# Patient Record
Sex: Female | Born: 1945
Health system: Southern US, Community
[De-identification: ages and names within clinical notes are randomized; demographics above are authoritative.]

## PROBLEM LIST (undated history)

## (undated) DIAGNOSIS — C801 Malignant (primary) neoplasm, unspecified: Secondary | ICD-10-CM

## (undated) DIAGNOSIS — I10 Essential (primary) hypertension: Secondary | ICD-10-CM

## (undated) DIAGNOSIS — K219 Gastro-esophageal reflux disease without esophagitis: Secondary | ICD-10-CM

## (undated) HISTORY — PX: BLADDER SURGERY: SHX569

---

## 2017-12-22 DIAGNOSIS — Z23 Encounter for immunization: Secondary | ICD-10-CM | POA: Diagnosis not present

## 2017-12-22 DIAGNOSIS — S61217A Laceration without foreign body of left little finger without damage to nail, initial encounter: Secondary | ICD-10-CM | POA: Diagnosis not present

## 2017-12-22 DIAGNOSIS — S61011A Laceration without foreign body of right thumb without damage to nail, initial encounter: Secondary | ICD-10-CM | POA: Diagnosis not present

## 2017-12-22 DIAGNOSIS — W268XXA Contact with other sharp object(s), not elsewhere classified, initial encounter: Secondary | ICD-10-CM | POA: Diagnosis not present

## 2017-12-22 DIAGNOSIS — S61112A Laceration without foreign body of left thumb with damage to nail, initial encounter: Secondary | ICD-10-CM | POA: Diagnosis not present

## 2017-12-30 DIAGNOSIS — Z4802 Encounter for removal of sutures: Secondary | ICD-10-CM | POA: Diagnosis not present

## 2018-04-12 DIAGNOSIS — Z23 Encounter for immunization: Secondary | ICD-10-CM | POA: Diagnosis not present

## 2018-04-12 DIAGNOSIS — Z7189 Other specified counseling: Secondary | ICD-10-CM | POA: Diagnosis not present

## 2018-04-12 DIAGNOSIS — Z1331 Encounter for screening for depression: Secondary | ICD-10-CM | POA: Diagnosis not present

## 2018-04-12 DIAGNOSIS — Z1211 Encounter for screening for malignant neoplasm of colon: Secondary | ICD-10-CM | POA: Diagnosis not present

## 2018-04-12 DIAGNOSIS — R5383 Other fatigue: Secondary | ICD-10-CM | POA: Diagnosis not present

## 2018-04-12 DIAGNOSIS — Z6828 Body mass index (BMI) 28.0-28.9, adult: Secondary | ICD-10-CM | POA: Diagnosis not present

## 2018-04-12 DIAGNOSIS — Z299 Encounter for prophylactic measures, unspecified: Secondary | ICD-10-CM | POA: Diagnosis not present

## 2018-04-12 DIAGNOSIS — I10 Essential (primary) hypertension: Secondary | ICD-10-CM | POA: Diagnosis not present

## 2018-04-12 DIAGNOSIS — Z1339 Encounter for screening examination for other mental health and behavioral disorders: Secondary | ICD-10-CM | POA: Diagnosis not present

## 2018-04-12 DIAGNOSIS — Z0000 Encounter for general adult medical examination without abnormal findings: Secondary | ICD-10-CM | POA: Diagnosis not present

## 2018-04-13 DIAGNOSIS — Z79899 Other long term (current) drug therapy: Secondary | ICD-10-CM | POA: Diagnosis not present

## 2018-04-13 DIAGNOSIS — R5383 Other fatigue: Secondary | ICD-10-CM | POA: Diagnosis not present

## 2018-05-09 DIAGNOSIS — Z86010 Personal history of colonic polyps: Secondary | ICD-10-CM | POA: Diagnosis not present

## 2018-05-19 DIAGNOSIS — Z1211 Encounter for screening for malignant neoplasm of colon: Secondary | ICD-10-CM | POA: Diagnosis not present

## 2018-05-19 DIAGNOSIS — K641 Second degree hemorrhoids: Secondary | ICD-10-CM | POA: Diagnosis not present

## 2018-05-19 DIAGNOSIS — D128 Benign neoplasm of rectum: Secondary | ICD-10-CM | POA: Diagnosis not present

## 2018-05-19 DIAGNOSIS — Z886 Allergy status to analgesic agent status: Secondary | ICD-10-CM | POA: Diagnosis not present

## 2018-05-19 DIAGNOSIS — K644 Residual hemorrhoidal skin tags: Secondary | ICD-10-CM | POA: Diagnosis not present

## 2018-05-19 DIAGNOSIS — Z86010 Personal history of colonic polyps: Secondary | ICD-10-CM | POA: Diagnosis not present

## 2018-05-19 DIAGNOSIS — K219 Gastro-esophageal reflux disease without esophagitis: Secondary | ICD-10-CM | POA: Diagnosis not present

## 2018-05-19 DIAGNOSIS — K621 Rectal polyp: Secondary | ICD-10-CM | POA: Diagnosis not present

## 2018-05-19 DIAGNOSIS — Z79899 Other long term (current) drug therapy: Secondary | ICD-10-CM | POA: Diagnosis not present

## 2018-05-19 DIAGNOSIS — Z90710 Acquired absence of both cervix and uterus: Secondary | ICD-10-CM | POA: Diagnosis not present

## 2018-05-19 DIAGNOSIS — I10 Essential (primary) hypertension: Secondary | ICD-10-CM | POA: Diagnosis not present

## 2018-06-06 DIAGNOSIS — Z86010 Personal history of colonic polyps: Secondary | ICD-10-CM | POA: Diagnosis not present

## 2018-07-27 ENCOUNTER — Other Ambulatory Visit: Payer: Self-pay | Admitting: Internal Medicine

## 2018-07-27 DIAGNOSIS — R921 Mammographic calcification found on diagnostic imaging of breast: Secondary | ICD-10-CM | POA: Diagnosis not present

## 2018-08-02 ENCOUNTER — Ambulatory Visit
Admission: RE | Admit: 2018-08-02 | Discharge: 2018-08-02 | Disposition: A | Discharge status: Home / Self Care | Payer: Medicare HMO | Source: Ambulatory Visit | Attending: Internal Medicine | Admitting: Internal Medicine

## 2018-08-02 DIAGNOSIS — R921 Mammographic calcification found on diagnostic imaging of breast: Secondary | ICD-10-CM

## 2018-08-02 DIAGNOSIS — D0512 Intraductal carcinoma in situ of left breast: Secondary | ICD-10-CM | POA: Diagnosis not present

## 2018-08-04 ENCOUNTER — Other Ambulatory Visit: Payer: Self-pay | Admitting: Internal Medicine

## 2018-08-04 DIAGNOSIS — R921 Mammographic calcification found on diagnostic imaging of breast: Secondary | ICD-10-CM

## 2018-08-06 DIAGNOSIS — C801 Malignant (primary) neoplasm, unspecified: Secondary | ICD-10-CM

## 2018-08-06 HISTORY — DX: Malignant (primary) neoplasm, unspecified: C80.1

## 2018-08-09 ENCOUNTER — Ambulatory Visit
Admission: RE | Admit: 2018-08-09 | Discharge: 2018-08-09 | Disposition: A | Discharge status: Home / Self Care | Payer: Medicare HMO | Source: Ambulatory Visit | Attending: Internal Medicine | Admitting: Internal Medicine

## 2018-08-09 DIAGNOSIS — R921 Mammographic calcification found on diagnostic imaging of breast: Secondary | ICD-10-CM

## 2018-08-09 DIAGNOSIS — N6092 Unspecified benign mammary dysplasia of left breast: Secondary | ICD-10-CM | POA: Diagnosis not present

## 2018-08-16 ENCOUNTER — Ambulatory Visit
Admission: RE | Admit: 2018-08-16 | Discharge: 2018-08-16 | Disposition: A | Discharge status: Home / Self Care | Payer: Medicare HMO | Source: Ambulatory Visit | Attending: Internal Medicine | Admitting: Internal Medicine

## 2018-08-16 DIAGNOSIS — R921 Mammographic calcification found on diagnostic imaging of breast: Secondary | ICD-10-CM

## 2018-08-16 DIAGNOSIS — D241 Benign neoplasm of right breast: Secondary | ICD-10-CM | POA: Diagnosis not present

## 2018-08-29 ENCOUNTER — Other Ambulatory Visit: Payer: Self-pay

## 2018-08-29 ENCOUNTER — Ambulatory Visit: Payer: Self-pay | Admitting: Surgery

## 2018-08-29 ENCOUNTER — Other Ambulatory Visit: Payer: Self-pay | Admitting: Surgery

## 2018-08-29 ENCOUNTER — Encounter (HOSPITAL_BASED_OUTPATIENT_CLINIC_OR_DEPARTMENT_OTHER): Payer: Self-pay | Admitting: *Deleted

## 2018-08-29 ENCOUNTER — Encounter (HOSPITAL_BASED_OUTPATIENT_CLINIC_OR_DEPARTMENT_OTHER)
Admission: RE | Admit: 2018-08-29 | Discharge: 2018-08-29 | Disposition: A | Discharge status: Home / Self Care | Payer: Medicare HMO | Source: Ambulatory Visit | Attending: Surgery | Admitting: Surgery

## 2018-08-29 DIAGNOSIS — D0512 Intraductal carcinoma in situ of left breast: Secondary | ICD-10-CM

## 2018-08-29 DIAGNOSIS — D249 Benign neoplasm of unspecified breast: Secondary | ICD-10-CM | POA: Diagnosis not present

## 2018-08-29 DIAGNOSIS — D241 Benign neoplasm of right breast: Secondary | ICD-10-CM | POA: Diagnosis not present

## 2018-08-29 DIAGNOSIS — Z01818 Encounter for other preprocedural examination: Secondary | ICD-10-CM | POA: Insufficient documentation

## 2018-08-29 LAB — BASIC METABOLIC PANEL
Anion gap: 6 (ref 5–15)
BUN: 12 mg/dL (ref 8–23)
CO2: 26 mmol/L (ref 22–32)
Calcium: 9.9 mg/dL (ref 8.9–10.3)
Chloride: 106 mmol/L (ref 98–111)
Creatinine, Ser: 0.67 mg/dL (ref 0.44–1.00)
GFR calc Af Amer: >60 mL/min (ref >60)
GFR calc non Af Amer: >60 mL/min (ref >60)
Glucose, Bld: 137 mg/dL — ABNORMAL HIGH (ref 70–99)
Potassium: 4.2 mmol/L (ref 3.5–5.1)
Sodium: 138 mmol/L (ref 135–145)

## 2018-08-29 NOTE — H&P (View-Only) (Signed)
Erika Gould Documented: 08/29/2018 10:00 AM Location: [facility] Patient #: [id number] DOB: [birth year] Widowed / Language: Erika Gould / Race: Black or African American Female  History of Present Illness Erika Gould A. Romaldo Saville MD; 08/29/2018 11:13 AM) Patient words: Patient sent at the request of Dr.Vyas for abnormal screening mammogram. The patient went recent bilateral screening mammography with subsequent diagnostic mammography and was found to have multiple areas of microcalcification. She underwent 2 biopsies on the left which showed high-grade DCIS with comedonecrosis and one area on the right which showed papillomatosis. She has no family history of breast cancer. This is the first biopsy she's had before. She is currently so warfarin both biopsies but is not having any significant pain, redness or drainage. Review of mammography reveals his areas to be quite faint. Size is difficult to discern looks like 7 - 8 mm in each biopsy site.                  ADDITIONAL INFORMATION: 1. PROGNOSTIC INDICATORS Results: IMMUNOHISTOCHEMICAL AND MORPHOMETRIC ANALYSIS PERFORMED MANUALLY Estrogen Receptor: 90%, POSITIVE, STRONG STAINING INTENSITY Progesterone Receptor: 50%, POSITIVE, STRONG STAINING INTENSITY REFERENCE RANGE ESTROGEN RECEPTOR NEGATIVE 0% POSITIVE =>1% REFERENCE RANGE PROGESTERONE RECEPTOR NEGATIVE 0% POSITIVE =>1% All controls stained appropriately Erika Sheller MD Pathologist, Electronic Signature ( Signed 02/02/20cornett1 FINAL DIAGNOSIS Diagnosis 1. Breast, left, needle core biopsy, upper outer - DUCTAL CARCINOMA IN SITU - SEE COMMENT 2. Breast, left, needle core biopsy, upper inner - DUCTAL CARCINOMA IN SITU - SEE COMMENT 1 of 3 FINAL for Erika Gould ([id number]) Microscopic Comment 1. Immunohistochemistry was performed on the biopsies to assess for an invasive component (SMM, calponin, and p63). Myoepithelial cells are present  supporting the diagnosis of ductal carcinoma in situ. E-cadherin is positive. Based on the biopsy, the ductal carcinoma in situ has a comedo pattern, high nuclear grade and measures 0.7 cm in greatest linear extent. Prognostic markers (ER/PR) are pending and will be reported in an addendum. Dr. Jeannie Gould has reviewed the case and agrees with the diagnosis. These results were called to [facility] on August 04, 2018. Erika Sheller MD Pathologist, Electronic Signature (Case signed 08/04/2018) Specimen Gross and Clinical Information Specimen Comment 1. TIF: 10 AM, extracted < 5 min; calcs 2. TIF: 1015 AM, extracted < 5 min Specimen(s) Obtained: 1. Breast, left, needle core biopsy, upper outer 2. Breast, left, needle core biopsy, upper inner Specimen Clinical       Diagnosis Breast, left, needle core biopsy, upper inner - ATYPICAL DUCTAL HYPERPLASIA WITH CALCIFICATIONS. SEE NOTE. Diagnosis Note Immunostain for CK5/6 is negative, consistent with the above diagnosis. Dr. Lyndon Gould has reviewed this case and concurs with the interpretation. [facility] was notified on 08/11/2018. (NK:ah 08/11/18) Erika Folds MD Pathologist, Electronic Signature (Case signed 08/11/2018) Specimen Gross and Clinical Information Specimen Comment TIF: 10 AM, extracted < 5 min; recent diagnosis DCIS left breast; this is an additional group of calcs Specimen(s) Obtained: Breast, left, needle core biopsy, upper inner Specimen Clinical          Diagnosis Breast, right, needle core biopsy, lower outer quadrant - DUCTAL PAPILLOMA(S). FLORID DUCT EPITHELIAL HYPERPLASIA. FIBROCYSTIC CHANGES. CALCIFICATIONS. Microscopic Comment Immunohistochemistry for p63, Calponin and SMM-1 demonstrates the presence of myoepithelium in the select focus. CK5/6 is positive. The results were reported to [facility] on 08/17/2018. [name] Pathologist, Electronic Signature (Case signed 08/18/2018) Specimen Gross and Clinical Information Specimen Comment In formalin 10:05;  extracted less than 5 minutes; calcifications; previous biopsy on left, not on right Specimen(s).  The patient is a 73 year old female.   Past Surgical History Erika Gould, [name]; 08/29/2018 10:00 AM) Breast Biopsy Bilateral. Colon Polyp Removal - Colonoscopy  Diagnostic Studies History Erika Gould, [name]; 08/29/2018 10:00 AM) Colonoscopy within last year Mammogram within last year Pap Smear >5 years ago  Allergies Erika Gould, [name]; 08/29/2018 10:01 AM) No Known Drug Allergies [08/29/2018]: Allergies Reconciled  Medication History Erika Gould, [name]; 08/29/2018 10:02 AM) Atenolol (50MG  Tablet, Oral) Active. Lisinopril-hydroCHLOROthiazide (20-12.5MG  Tablet, Oral) Active. Medications Reconciled  Social History Erika Gould, [name]; 08/29/2018 10:00 AM) Caffeine use Coffee. No alcohol use No drug use Tobacco use Never smoker.  Family History Erika Gould, [name]; 08/29/2018 10:00 AM) Breast Cancer Mother, Sister. Colon Polyps Brother, Sister. Diabetes Mellitus Mother. Respiratory Condition Father.  Pregnancy / Birth History Erika Gould, [name]; 08/29/2018 10:00 AM) Age of menopause 91-55 Gravida 2 Maternal age 47-25 Para 2  Other Problems Erika Gould, [place]; 08/29/2018 10:00 AM) Gastroesophageal Reflux Disease High blood pressure     Review of Systems (Erika Borromeo A. Laree Garron MD; 08/29/2018 11:13 AM) General Not Present- Appetite Loss, Chills, Fatigue, Fever, Night Sweats, Weight Gain and Weight Loss. Skin Not Present- Change in Wart/Mole, Dryness, Hives, Jaundice, New Lesions, Non-Healing Wounds, Rash and Ulcer. HEENT Not Present- Earache, Hearing Loss, Hoarseness, Nose Bleed, Oral Ulcers, Ringing in the Ears, Seasonal Allergies, Sinus Pain, Sore Throat, Visual Disturbances, Wears glasses/contact lenses and  Yellow Eyes. Respiratory Not Present- Bloody sputum, Chronic Cough, Difficulty Breathing, Snoring and Wheezing. Breast Not Present- Breast Mass, Breast Pain, Nipple Discharge and Skin Changes. Cardiovascular Not Present- Chest Pain, Difficulty Breathing Lying Down, Leg Cramps, Palpitations, Rapid Heart Rate, Shortness of Breath and Swelling of Extremities. Gastrointestinal Not Present- Abdominal Pain, Bloating, Bloody Stool, Change in Bowel Habits, Chronic diarrhea, Constipation, Difficulty Swallowing, Excessive gas, Gets full quickly at meals, Hemorrhoids, Indigestion, Nausea, Rectal Pain and Vomiting. Female Genitourinary Not Present- Frequency, Nocturia, Painful Urination, Pelvic Pain and Urgency. Musculoskeletal Present- Joint Pain. Not Present- Back Pain, Joint Stiffness, Muscle Pain, Muscle Weakness and Swelling of Extremities. Neurological Not Present- Decreased Memory, Fainting, Headaches, Numbness, Seizures, Tingling, Tremor, Trouble walking and Weakness. Psychiatric Not Present- Anxiety, Bipolar, Change in Sleep Pattern, Depression, Fearful and Frequent crying. Endocrine Not Present- Cold Intolerance, Excessive Hunger, Hair Changes, Heat Intolerance, Hot flashes and New Diabetes. Hematology Not Present- Blood Thinners, Easy Bruising, Excessive bleeding, Gland problems, HIV and Persistent Infections. All other systems negative  Vitals Erika Gould [name]; 08/29/2018 10:02 AM) 08/29/2018 10:02 AM Weight: 183.5 lb Height: 66in Body Surface Area: 1.93 m Body Mass Index: 29.62 kg/m  Temp.: 97.83F(Oral)  Pulse: 60 (Regular)  BP: 128/72 (Sitting, Left Arm, Standard)      Physical Exam (Tatijana Bierly A. Cisco Kindt MD; 08/29/2018 11:14 AM)  General Mental Status-Alert. General Appearance-Consistent with stated age. Hydration-Well hydrated. Voice-Normal.  Head and Neck Head-normocephalic, atraumatic with no lesions or palpable masses. Trachea-midline. Thyroid Gland  Characteristics - normal size and consistency.  Eye Eyeball - Bilateral-Extraocular movements intact. Sclera/Conjunctiva - Bilateral-No scleral icterus.  Chest and Lung Exam Chest and lung exam reveals -quiet, even and easy respiratory effort with no use of accessory muscles and on auscultation, normal breath sounds, no adventitious sounds and normal vocal resonance. Inspection Chest Wall - Normal. Back - normal.  Breast Breast - Left-Symmetric, Non Tender, No Biopsy scars, no Dimpling, No Inflammation, No Lumpectomy scars, No Mastectomy scars, No Peau d' Orange. Breast - Right-Symmetric, Non  Tender, No Biopsy scars, no Dimpling, No Inflammation, No Lumpectomy scars, No Mastectomy scars, No Peau d' Orange. Breast Lump-No Palpable Breast Mass.  Cardiovascular Cardiovascular examination reveals -normal heart sounds, regular rate and rhythm with no murmurs and normal pedal pulses bilaterally.  Abdomen Inspection Inspection of the abdomen reveals - No Hernias. Skin - Scar - no surgical scars. Palpation/Percussion Palpation and Percussion of the abdomen reveal - Soft, Non Tender, No Rebound tenderness, No Rigidity (guarding) and No hepatosplenomegaly. Auscultation Auscultation of the abdomen reveals - Bowel sounds normal.  Neurologic Neurologic evaluation reveals -alert and oriented x 3 with no impairment of recent or remote memory. Mental Status-Normal.  Musculoskeletal Normal Exam - Left-Upper Extremity Strength Normal and Lower Extremity Strength Normal. Normal Exam - Right-Upper Extremity Strength Normal and Lower Extremity Strength Normal.  Lymphatic Head & Neck  General Head & Neck Lymphatics: Bilateral - Description - Normal. Axillary  General Axillary Region: Bilateral - Description - Normal. Tenderness - Non Tender.    Assessment & Plan (Kelley Polinsky A. Nyquan Selbe MD; 08/29/2018 11:15 AM)  BREAST NEOPLASM, TIS (DCIS), LEFT (D05.12) Impression: Reviewed  options of treatment which include breast conserving surgery, mastectomy and reconstruction, and clinical trials. Given her high grade nature she would not be a good clinical trial candidate Risk of lumpectomy include bleeding, infection, seroma, more surgery, use of seed/wire, wound care, cosmetic deformity and the need for other treatments, death , blood clots, death. Pt agrees to proceed. Marland Kitchen She would like to proceed with lumpectomy and try to conserve her breast. She will need 2 lumpectomies on the left and recommend warm the right for the papillomatosis. She understands she may need further surgery depending on margin status and if there is more extensive disease. I doubt MRI would add much in her circumstance. Refer to medical and radiation oncology.   PAPILLOMA OF BREAST (D24.9)   PAPILLOMA OF RIGHT BREAST (D24.1)  Current Plans You are being scheduled for surgery- Our schedulers will call you.  You should hear from our office's scheduling department within 5 working days about the location, date, and time of surgery. We try to make accommodations for patient's preferences in scheduling surgery, but sometimes the OR schedule or the surgeon's schedule prevents [place] from making those accommodations.  If you have not heard from our office [phone]) in 5 working days, call the office and ask for your surgeon's nurse.  If you have other questions about your diagnosis, plan, or surgery, call the office and ask for your surgeon's nurse.  Pt Education - [facility] Breast Biopsy HCI: discussed with patient and provided information. Pt Education - flb breast cancer surgery: discussed with patient and provided information.

## 2018-08-29 NOTE — H&P (Signed)
Erika Gould Documented: 08/29/2018 10:00 AM Location: [facility] Patient #: [id number] DOB: 1946-05-13 Widowed / Language: Erika Gould / Race: Black or African American Female  History of Present Illness Erika Moores A. Racine Erby MD; 08/29/2018 11:13 AM) Patient words: Patient sent at Erika request of Dr.Vyas for abnormal screening mammogram. Erika patient went recent bilateral screening mammography with subsequent diagnostic mammography and was found to have multiple areas of microcalcification. She underwent 2 biopsies on Erika left which showed high-grade DCIS with comedonecrosis and one area on Erika right which showed papillomatosis. She has no family history of breast cancer. This is Erika first biopsy she's had before. She is currently so warfarin both biopsies but is not having any significant pain, redness or drainage. Review of mammography reveals his areas to be quite faint. Size is difficult to discern looks like 7 - 8 mm in each biopsy site.                  ADDITIONAL INFORMATION: 1. PROGNOSTIC INDICATORS Results: IMMUNOHISTOCHEMICAL AND MORPHOMETRIC ANALYSIS PERFORMED MANUALLY Estrogen Receptor: 90%, POSITIVE, STRONG STAINING INTENSITY Progesterone Receptor: 50%, POSITIVE, STRONG STAINING INTENSITY REFERENCE RANGE ESTROGEN RECEPTOR NEGATIVE 0% POSITIVE =>1% REFERENCE RANGE PROGESTERONE RECEPTOR NEGATIVE 0% POSITIVE =>1% All controls stained appropriately Erika Gould, Electronic Signature ( Signed 02/02/20cornett1 FINAL DIAGNOSIS Diagnosis 1. Breast, left, needle core biopsy, upper outer - DUCTAL CARCINOMA IN SITU - SEE COMMENT 2. Breast, left, needle core biopsy, upper inner - DUCTAL CARCINOMA IN SITU - SEE COMMENT 1 of 3 FINAL for Erika Gould ([id number]) Microscopic Comment 1. Immunohistochemistry was performed on Erika biopsies to assess for an invasive component (SMM, calponin, and p63). Myoepithelial cells are present  supporting Erika diagnosis of ductal carcinoma in situ. E-cadherin is positive. Based on Erika biopsy, Erika ductal carcinoma in situ has a comedo pattern, high nuclear grade and measures 0.7 cm in greatest linear extent. Prognostic markers (ER/PR) are pending and will be reported in an addendum. Dr. Jeannie Gould has reviewed Erika case and agrees with Erika diagnosis. These results were called to Erika Gould on August 04, 2018. Erika Gould, Electronic Signature (Case signed 08/04/2018) Specimen Gross and Clinical Information Specimen Comment 1. TIF: 10 AM, extracted < 5 min; calcs 2. TIF: 1015 AM, extracted < 5 min Specimen(s) Obtained: 1. Breast, left, needle core biopsy, upper outer 2. Breast, left, needle core biopsy, upper inner Specimen Clinical       Diagnosis Breast, left, needle core biopsy, upper inner - ATYPICAL DUCTAL HYPERPLASIA WITH CALCIFICATIONS. SEE NOTE. Diagnosis Note Immunostain for CK5/6 is negative, consistent with Erika above diagnosis. Dr. Lyndon Gould has reviewed this case and concurs with Erika interpretation. Erika Gould was notified on 08/11/2018. (Erika Gould:ah 08/11/18) Erika Folds MD Gould, Electronic Signature (Case signed 08/11/2018) Specimen Gross and Clinical Information Specimen Comment TIF: 10 AM, extracted < 5 min; recent diagnosis DCIS left breast; this is an additional group of calcs Specimen(s) Obtained: Breast, left, needle core biopsy, upper inner Specimen Clinical          Diagnosis Breast, right, needle core biopsy, lower outer quadrant - DUCTAL PAPILLOMA(S). FLORID DUCT EPITHELIAL HYPERPLASIA. FIBROCYSTIC CHANGES. CALCIFICATIONS. Microscopic Comment Immunohistochemistry for p63, Calponin and SMM-1 demonstrates Erika presence of myoepithelium in Erika select focus. CK5/6 is positive. Erika results were reported to Erika [place] on 08/17/2018. [name] Gould, Electronic Signature (Case signed 08/18/2018) Specimen Gross and Clinical Information Specimen Comment In formalin 10:05;  extracted less than 5 minutes; calcifications; previous biopsy on left, not on right Specimen(s).  Erika patient is a 73 year old female.   Past Surgical History Erika Gould, Erika Gould; 08/29/2018 10:00 AM) Breast Biopsy Bilateral. Colon Polyp Removal - Colonoscopy  Diagnostic Studies History Erika Gould, [name]; 08/29/2018 10:00 AM) Colonoscopy within last year Mammogram within last year Pap Smear >5 years ago  Allergies Erika Gould, [place]; 08/29/2018 10:01 AM) No Known Drug Allergies [08/29/2018]: Allergies Reconciled  Medication History Erika Gould, [name]; 08/29/2018 10:02 AM) Atenolol (50MG  Tablet, Oral) Active. Lisinopril-hydroCHLOROthiazide (20-12.5MG  Tablet, Oral) Active. Medications Reconciled  Social History Erika Gould, [name]; 08/29/2018 10:00 AM) Caffeine use Coffee. No alcohol use No drug use Tobacco use Never smoker.  Family History Erika Gould, [place]; 08/29/2018 10:00 AM) Breast Cancer Mother, Sister. Colon Polyps Brother, Sister. Diabetes Mellitus Mother. Respiratory Condition Father.  Pregnancy / Birth History Erika Gould, [place]; 08/29/2018 10:00 AM) Age of menopause 50-55 Gravida 2 Maternal age 28-25 Para 2  Other Problems Erika Gould, [place]; 08/29/2018 10:00 AM) Gastroesophageal Reflux Disease High blood pressure     Review of Systems (Calel Pisarski A. Onnika Siebel MD; 08/29/2018 11:13 AM) General Not Present- Appetite Loss, Chills, Fatigue, Fever, Night Sweats, Weight Gain and Weight Loss. Skin Not Present- Change in Wart/Mole, Dryness, Hives, Jaundice, New Lesions, Non-Healing Wounds, Rash and Ulcer. HEENT Not Present- Earache, Hearing Loss, Hoarseness, Nose Bleed, Oral Ulcers, Ringing in Erika Ears, Seasonal Allergies, Sinus Pain, Sore Throat, Visual Disturbances, Wears glasses/contact lenses and  Yellow Eyes. Respiratory Not Present- Bloody sputum, Chronic Cough, Difficulty Breathing, Snoring and Wheezing. Breast Not Present- Breast Mass, Breast Pain, Nipple Discharge and Skin Changes. Cardiovascular Not Present- Chest Pain, Difficulty Breathing Lying Down, Leg Cramps, Palpitations, Rapid Heart Rate, Shortness of Breath and Swelling of Extremities. Gastrointestinal Not Present- Abdominal Pain, Bloating, Bloody Stool, Change in Bowel Habits, Chronic diarrhea, Constipation, Difficulty Swallowing, Excessive gas, Gets full quickly at meals, Hemorrhoids, Indigestion, Nausea, Rectal Pain and Vomiting. Female Genitourinary Not Present- Frequency, Nocturia, Painful Urination, Pelvic Pain and Urgency. Musculoskeletal Present- Joint Pain. Not Present- Back Pain, Joint Stiffness, Muscle Pain, Muscle Weakness and Swelling of Extremities. Neurological Not Present- Decreased Memory, Fainting, Headaches, Numbness, Seizures, Tingling, Tremor, Trouble walking and Weakness. Psychiatric Not Present- Anxiety, Bipolar, Change in Sleep Pattern, Depression, Fearful and Frequent crying. Endocrine Not Present- Cold Intolerance, Excessive Hunger, Hair Changes, Heat Intolerance, Hot flashes and New Diabetes. Hematology Not Present- Blood Thinners, Easy Bruising, Excessive bleeding, Gland problems, HIV and Persistent Infections. All other systems negative  Vitals Erika Gould [name]; 08/29/2018 10:02 AM) 08/29/2018 10:02 AM Weight: 183.5 lb Height: 66in Body Surface Area: 1.93 m Body Mass Index: 29.62 kg/m  Temp.: 97.18F(Oral)  Pulse: 60 (Regular)  BP: 128/72 (Sitting, Left Arm, Standard)      Physical Exam (Imran Nuon A. Murl Zogg MD; 08/29/2018 11:14 AM)  General Mental Status-Alert. General Appearance-Consistent with stated age. Hydration-Well hydrated. Voice-Normal.  Head and Neck Head-normocephalic, atraumatic with no lesions or palpable masses. Trachea-midline. Thyroid Gland  Characteristics - normal size and consistency.  Eye Eyeball - Bilateral-Extraocular movements intact. Sclera/Conjunctiva - Bilateral-No scleral icterus.  Chest and Lung Exam Chest and lung exam reveals -quiet, even and easy respiratory effort with no use of accessory muscles and on auscultation, normal breath sounds, no adventitious sounds and normal vocal resonance. Inspection Chest Wall - Normal. Back - normal.  Breast Breast - Left-Symmetric, Non Tender, No Biopsy scars, no Dimpling, No Inflammation, No Lumpectomy scars, No Mastectomy scars, No Peau d' Orange. Breast - Right-Symmetric, Non  Tender, No Biopsy scars, no Dimpling, No Inflammation, No Lumpectomy scars, No Mastectomy scars, No Peau d' Orange. Breast Lump-No Palpable Breast Mass.  Cardiovascular Cardiovascular examination reveals -normal heart sounds, regular rate and rhythm with no murmurs and normal pedal pulses bilaterally.  Abdomen Inspection Inspection of Erika abdomen reveals - No Hernias. Skin - Scar - no surgical scars. Palpation/Percussion Palpation and Percussion of Erika abdomen reveal - Soft, Non Tender, No Rebound tenderness, No Rigidity (guarding) and No hepatosplenomegaly. Auscultation Auscultation of Erika abdomen reveals - Bowel sounds normal.  Neurologic Neurologic evaluation reveals -alert and oriented x 3 with no impairment of recent or remote memory. Mental Status-Normal.  Musculoskeletal Normal Exam - Left-Upper Extremity Strength Normal and Lower Extremity Strength Normal. Normal Exam - Right-Upper Extremity Strength Normal and Lower Extremity Strength Normal.  Lymphatic Head & Neck  General Head & Neck Lymphatics: Bilateral - Description - Normal. Axillary  General Axillary Region: Bilateral - Description - Normal. Tenderness - Non Tender.    Assessment & Plan (Gergory Biello A. Kabrina Christiano MD; 08/29/2018 11:15 AM)  BREAST NEOPLASM, TIS (DCIS), LEFT (D05.12) Impression: Reviewed  options of treatment which include breast conserving surgery, mastectomy and reconstruction, and clinical trials. Given her high grade nature she would not be a good clinical trial candidate Risk of lumpectomy include bleeding, infection, seroma, more surgery, use of seed/wire, wound care, cosmetic deformity and Erika need for other treatments, death , blood clots, death. Pt agrees to proceed. Marland Kitchen She would like to proceed with lumpectomy and try to conserve her breast. She will need 2 lumpectomies on Erika left and recommend warm Erika right for Erika papillomatosis. She understands she may need further surgery depending on margin status and if there is more extensive disease. I doubt MRI would add much in her circumstance. Refer to medical and radiation oncology.   PAPILLOMA OF BREAST (D24.9)   PAPILLOMA OF RIGHT BREAST (D24.1)  Current Plans You are being scheduled for surgery- Our schedulers will call you.  You should hear from our office's scheduling department within 5 working days about Erika location, date, and time of surgery. We try to make accommodations for patient's preferences in scheduling surgery, but sometimes Erika OR schedule or Erika surgeon's schedule prevents [place] from making those accommodations.  If you have not heard from our office [phone]) in 5 working days, call Erika office and ask for your surgeon's nurse.  If you have other questions about your diagnosis, plan, or surgery, call Erika office and ask for your surgeon's nurse.  Pt Education - [facility] Breast Biopsy HCI: discussed with patient and provided information. Pt Education - flb breast cancer surgery: discussed with patient and provided information.

## 2018-08-29 NOTE — Progress Notes (Signed)
Patient had EKG and BMET in PAT. Patient given surgical soap and ERASE ensure drink, instructions given and patient verbalized understanding.

## 2018-08-30 NOTE — Progress Notes (Signed)
EKG reviewed by Dr. Turk, will proceed with surgery as scheduled. 

## 2018-09-05 ENCOUNTER — Encounter: Payer: Self-pay | Admitting: Hematology and Oncology

## 2018-09-05 ENCOUNTER — Telehealth: Payer: Self-pay | Admitting: Hematology and Oncology

## 2018-09-05 NOTE — Telephone Encounter (Signed)
A new patient breast appt has been scheduled for the pt to see Dr. Lindi Adie on 4/1 at 3pm. Letter mailed to the pt.

## 2018-09-19 ENCOUNTER — Ambulatory Visit
Admission: RE | Admit: 2018-09-19 | Discharge: 2018-09-19 | Disposition: A | Discharge status: Home / Self Care | Payer: Medicare HMO | Source: Ambulatory Visit | Attending: Surgery | Admitting: Surgery

## 2018-09-19 ENCOUNTER — Other Ambulatory Visit: Payer: Self-pay

## 2018-09-19 ENCOUNTER — Other Ambulatory Visit: Payer: Self-pay | Admitting: Surgery

## 2018-09-19 DIAGNOSIS — D0512 Intraductal carcinoma in situ of left breast: Secondary | ICD-10-CM

## 2018-09-19 DIAGNOSIS — N6082 Other benign mammary dysplasias of left breast: Secondary | ICD-10-CM | POA: Diagnosis not present

## 2018-09-19 DIAGNOSIS — D241 Benign neoplasm of right breast: Secondary | ICD-10-CM | POA: Diagnosis not present

## 2018-09-19 NOTE — Anesthesia Preprocedure Evaluation (Addendum)
Anesthesia Evaluation  Patient identified by MRN, date of birth, ID band Patient awake    Reviewed: Allergy & Precautions, H&P , NPO status , Patient's Chart, lab work & pertinent test results  Airway Mallampati: I  TM Distance: >3 FB Neck ROM: Full    Dental no notable dental hx. (+) Teeth Intact,    Pulmonary neg pulmonary ROS,    Pulmonary exam normal breath sounds clear to auscultation       Cardiovascular Exercise Tolerance: Good hypertension, Pt. on medications and Pt. on home beta blockers negative cardio ROS Normal cardiovascular exam Rhythm:Regular Rate:Normal  EKG 2/20 Sinus bradycardia with Premature atrial complexes Minimal voltage criteria for LVH, may be normal variant   Neuro/Psych negative neurological ROS  negative psych ROS   GI/Hepatic negative GI ROS, Neg liver ROS, GERD  Medicated,  Endo/Other  negative endocrine ROS  Renal/GU negative Renal ROS  negative genitourinary   Musculoskeletal negative musculoskeletal ROS (+)   Abdominal   Peds negative pediatric ROS (+)  Hematology negative hematology ROS (+)   Anesthesia Other Findings   Reproductive/Obstetrics negative OB ROS                            Anesthesia Physical Anesthesia Plan  ASA: II  Anesthesia Plan: General   Post-op Pain Management:    Induction: Intravenous  PONV Risk Score and Plan: 3 and Ondansetron, Treatment may vary due to age or medical condition, Dexamethasone and Midazolam  Airway Management Planned: Oral ETT  Additional Equipment:   Intra-op Plan:   Post-operative Plan: Extubation in OR  Informed Consent: I have reviewed the patients History and Physical, chart, labs and discussed the procedure including the risks, benefits and alternatives for the proposed anesthesia with the patient or authorized representative who has indicated his/her understanding and acceptance.        Plan Discussed with: CRNA, Anesthesiologist and Surgeon  Anesthesia Plan Comments: (  )        Anesthesia Quick Evaluation

## 2018-09-20 ENCOUNTER — Ambulatory Visit
Admission: RE | Admit: 2018-09-20 | Discharge: 2018-09-20 | Disposition: A | Discharge status: Home / Self Care | Payer: Medicare HMO | Source: Ambulatory Visit | Attending: Surgery | Admitting: Surgery

## 2018-09-20 ENCOUNTER — Encounter (HOSPITAL_BASED_OUTPATIENT_CLINIC_OR_DEPARTMENT_OTHER)
Admission: RE | Disposition: A | Discharge status: Home / Self Care | Payer: Self-pay | Source: Home / Self Care | Attending: Surgery

## 2018-09-20 ENCOUNTER — Ambulatory Visit (HOSPITAL_BASED_OUTPATIENT_CLINIC_OR_DEPARTMENT_OTHER): Payer: Medicare HMO | Admitting: Anesthesiology

## 2018-09-20 ENCOUNTER — Encounter (HOSPITAL_BASED_OUTPATIENT_CLINIC_OR_DEPARTMENT_OTHER): Payer: Self-pay | Admitting: *Deleted

## 2018-09-20 ENCOUNTER — Ambulatory Visit (HOSPITAL_BASED_OUTPATIENT_CLINIC_OR_DEPARTMENT_OTHER)
Admission: RE | Admit: 2018-09-20 | Discharge: 2018-09-20 | Disposition: A | Discharge status: Home / Self Care | Payer: Medicare HMO | Attending: Surgery | Admitting: Surgery

## 2018-09-20 ENCOUNTER — Other Ambulatory Visit: Payer: Self-pay

## 2018-09-20 DIAGNOSIS — Z170 Estrogen receptor positive status [ER+]: Secondary | ICD-10-CM | POA: Insufficient documentation

## 2018-09-20 DIAGNOSIS — D242 Benign neoplasm of left breast: Secondary | ICD-10-CM | POA: Insufficient documentation

## 2018-09-20 DIAGNOSIS — I10 Essential (primary) hypertension: Secondary | ICD-10-CM | POA: Insufficient documentation

## 2018-09-20 DIAGNOSIS — D0512 Intraductal carcinoma in situ of left breast: Secondary | ICD-10-CM

## 2018-09-20 DIAGNOSIS — Z79899 Other long term (current) drug therapy: Secondary | ICD-10-CM | POA: Diagnosis not present

## 2018-09-20 DIAGNOSIS — N6011 Diffuse cystic mastopathy of right breast: Secondary | ICD-10-CM | POA: Insufficient documentation

## 2018-09-20 DIAGNOSIS — D241 Benign neoplasm of right breast: Secondary | ICD-10-CM | POA: Insufficient documentation

## 2018-09-20 DIAGNOSIS — N6092 Unspecified benign mammary dysplasia of left breast: Secondary | ICD-10-CM | POA: Diagnosis not present

## 2018-09-20 DIAGNOSIS — N6082 Other benign mammary dysplasias of left breast: Secondary | ICD-10-CM | POA: Diagnosis not present

## 2018-09-20 DIAGNOSIS — N6012 Diffuse cystic mastopathy of left breast: Secondary | ICD-10-CM | POA: Diagnosis not present

## 2018-09-20 HISTORY — DX: Essential (primary) hypertension: I10

## 2018-09-20 HISTORY — DX: Gastro-esophageal reflux disease without esophagitis: K21.9

## 2018-09-20 HISTORY — DX: Malignant (primary) neoplasm, unspecified: C80.1

## 2018-09-20 HISTORY — PX: BREAST LUMPECTOMY WITH RADIOACTIVE SEED LOCALIZATION: SHX6424

## 2018-09-20 SURGERY — BREAST LUMPECTOMY WITH RADIOACTIVE SEED LOCALIZATION
Anesthesia: General | Site: Breast | Laterality: Bilateral

## 2018-09-20 MED ORDER — IBUPROFEN 800 MG PO TABS: 800.0000 mg | ORAL_TABLET | Freq: Three times a day (TID) | ORAL | 0 refills | Status: DC | PRN

## 2018-09-20 MED ORDER — PROPOFOL 10 MG/ML IV BOLUS
INTRAVENOUS | Status: AC
  Filled 2018-09-20: qty 20

## 2018-09-20 MED ORDER — ONDANSETRON 4 MG PO TBDP
4.0000 mg | ORAL_TABLET | Freq: Once | ORAL | Status: AC
  Administered 2018-09-20: 4 mg via ORAL

## 2018-09-20 MED ORDER — ACETAMINOPHEN 325 MG PO TABS: 325.0000 mg | ORAL_TABLET | ORAL | Status: DC | PRN

## 2018-09-20 MED ORDER — CEFAZOLIN SODIUM-DEXTROSE 2-4 GM/100ML-% IV SOLN
INTRAVENOUS | Status: AC
  Filled 2018-09-20: qty 100

## 2018-09-20 MED ORDER — MIDAZOLAM HCL 2 MG/2ML IJ SOLN: 1.0000 mg | INTRAMUSCULAR | Status: DC | PRN

## 2018-09-20 MED ORDER — ONDANSETRON HCL 4 MG/2ML IJ SOLN: 4.0000 mg | Freq: Once | INTRAMUSCULAR | Status: DC | PRN

## 2018-09-20 MED ORDER — GABAPENTIN 300 MG PO CAPS
300.0000 mg | ORAL_CAPSULE | ORAL | Status: AC
  Administered 2018-09-20: 300 mg via ORAL

## 2018-09-20 MED ORDER — ACETAMINOPHEN 500 MG PO TABS
ORAL_TABLET | ORAL | Status: AC
  Filled 2018-09-20: qty 2

## 2018-09-20 MED ORDER — CEFAZOLIN SODIUM-DEXTROSE 2-4 GM/100ML-% IV SOLN
2.0000 g | INTRAVENOUS | Status: AC
  Administered 2018-09-20: 2 g via INTRAVENOUS

## 2018-09-20 MED ORDER — FENTANYL CITRATE (PF) 100 MCG/2ML IJ SOLN
25.0000 ug | INTRAMUSCULAR | Status: DC | PRN
  Administered 2018-09-20: 50 ug via INTRAVENOUS

## 2018-09-20 MED ORDER — EPHEDRINE SULFATE 50 MG/ML IJ SOLN
INTRAMUSCULAR | Status: DC | PRN
  Administered 2018-09-20 (×3): 10 mg via INTRAVENOUS

## 2018-09-20 MED ORDER — ONDANSETRON HCL 4 MG/2ML IJ SOLN
INTRAMUSCULAR | Status: DC | PRN
  Administered 2018-09-20: 4 mg via INTRAVENOUS

## 2018-09-20 MED ORDER — GABAPENTIN 300 MG PO CAPS
ORAL_CAPSULE | ORAL | Status: AC
  Filled 2018-09-20: qty 1

## 2018-09-20 MED ORDER — DEXAMETHASONE SODIUM PHOSPHATE 10 MG/ML IJ SOLN
INTRAMUSCULAR | Status: AC
  Filled 2018-09-20: qty 1

## 2018-09-20 MED ORDER — SODIUM CHLORIDE (PF) 0.9 % IJ SOLN
INTRAMUSCULAR | Status: AC
  Filled 2018-09-20: qty 10

## 2018-09-20 MED ORDER — FENTANYL CITRATE (PF) 100 MCG/2ML IJ SOLN
50.0000 ug | INTRAMUSCULAR | Status: AC | PRN
  Administered 2018-09-20: 50 ug via INTRAVENOUS
  Administered 2018-09-20 (×2): 25 ug via INTRAVENOUS

## 2018-09-20 MED ORDER — BUPIVACAINE-EPINEPHRINE (PF) 0.25% -1:200000 IJ SOLN
INTRAMUSCULAR | Status: AC
  Filled 2018-09-20: qty 120

## 2018-09-20 MED ORDER — CELECOXIB 200 MG PO CAPS
ORAL_CAPSULE | ORAL | Status: AC
  Filled 2018-09-20: qty 1

## 2018-09-20 MED ORDER — CELECOXIB 200 MG PO CAPS
200.0000 mg | ORAL_CAPSULE | ORAL | Status: AC
  Administered 2018-09-20: 200 mg via ORAL

## 2018-09-20 MED ORDER — LIDOCAINE 2% (20 MG/ML) 5 ML SYRINGE
INTRAMUSCULAR | Status: DC | PRN
  Administered 2018-09-20: 60 mg via INTRAVENOUS

## 2018-09-20 MED ORDER — CHLORHEXIDINE GLUCONATE CLOTH 2 % EX PADS: 6.0000 | MEDICATED_PAD | Freq: Once | CUTANEOUS | Status: DC

## 2018-09-20 MED ORDER — LACTATED RINGERS IV SOLN
INTRAVENOUS | Status: DC
  Administered 2018-09-20 (×2): via INTRAVENOUS

## 2018-09-20 MED ORDER — MEPERIDINE HCL 25 MG/ML IJ SOLN: 6.2500 mg | INTRAMUSCULAR | Status: DC | PRN

## 2018-09-20 MED ORDER — PROPOFOL 10 MG/ML IV BOLUS
INTRAVENOUS | Status: DC | PRN
  Administered 2018-09-20: 150 mg via INTRAVENOUS

## 2018-09-20 MED ORDER — SCOPOLAMINE 1 MG/3DAYS TD PT72: 1.0000 | MEDICATED_PATCH | Freq: Once | TRANSDERMAL | Status: DC | PRN

## 2018-09-20 MED ORDER — OXYCODONE HCL 5 MG/5ML PO SOLN: 5.0000 mg | Freq: Once | ORAL | Status: DC | PRN

## 2018-09-20 MED ORDER — SODIUM CHLORIDE 0.9 % IV SOLN
INTRAVENOUS | Status: DC | PRN
  Administered 2018-09-20: 50 ug/min via INTRAVENOUS

## 2018-09-20 MED ORDER — ONDANSETRON HCL 4 MG/2ML IJ SOLN
INTRAMUSCULAR | Status: AC
  Filled 2018-09-20: qty 2

## 2018-09-20 MED ORDER — ONDANSETRON 4 MG PO TBDP
ORAL_TABLET | ORAL | Status: AC
  Filled 2018-09-20: qty 1

## 2018-09-20 MED ORDER — METHYLENE BLUE 0.5 % INJ SOLN
INTRAVENOUS | Status: AC
  Filled 2018-09-20: qty 10

## 2018-09-20 MED ORDER — FENTANYL CITRATE (PF) 100 MCG/2ML IJ SOLN
INTRAMUSCULAR | Status: AC
  Filled 2018-09-20: qty 2

## 2018-09-20 MED ORDER — OXYCODONE HCL 5 MG PO TABS: 5.0000 mg | ORAL_TABLET | Freq: Once | ORAL | Status: DC | PRN

## 2018-09-20 MED ORDER — DEXAMETHASONE SODIUM PHOSPHATE 4 MG/ML IJ SOLN
INTRAMUSCULAR | Status: DC | PRN
  Administered 2018-09-20: 10 mg via INTRAVENOUS

## 2018-09-20 MED ORDER — BUPIVACAINE-EPINEPHRINE (PF) 0.25% -1:200000 IJ SOLN
INTRAMUSCULAR | Status: DC | PRN
  Administered 2018-09-20: 20 mL
  Administered 2018-09-20: 10 mL

## 2018-09-20 MED ORDER — ACETAMINOPHEN 500 MG PO TABS
1000.0000 mg | ORAL_TABLET | ORAL | Status: AC
  Administered 2018-09-20: 1000 mg via ORAL

## 2018-09-20 MED ORDER — PHENYLEPHRINE HCL 10 MG/ML IJ SOLN
INTRAMUSCULAR | Status: DC | PRN
  Administered 2018-09-20: 80 ug via INTRAVENOUS

## 2018-09-20 MED ORDER — ACETAMINOPHEN 160 MG/5ML PO SOLN: 325.0000 mg | ORAL | Status: DC | PRN

## 2018-09-20 MED ORDER — HYDROCODONE-ACETAMINOPHEN 5-325 MG PO TABS: 1.0000 | ORAL_TABLET | Freq: Four times a day (QID) | ORAL | 0 refills | Status: DC | PRN

## 2018-09-20 MED ORDER — LIDOCAINE 2% (20 MG/ML) 5 ML SYRINGE
INTRAMUSCULAR | Status: AC
  Filled 2018-09-20: qty 5

## 2018-09-20 SURGICAL SUPPLY — 52 items
APPLIER CLIP 9.375 MED OPEN (MISCELLANEOUS) ×3
BINDER BREAST LRG (GAUZE/BANDAGES/DRESSINGS) IMPLANT
BINDER BREAST MEDIUM (GAUZE/BANDAGES/DRESSINGS) IMPLANT
BINDER BREAST XLRG (GAUZE/BANDAGES/DRESSINGS) IMPLANT
BINDER BREAST XXLRG (GAUZE/BANDAGES/DRESSINGS) IMPLANT
BLADE SURG 15 STRL LF DISP TIS (BLADE) ×1 IMPLANT
BLADE SURG 15 STRL SS (BLADE) ×2
CANISTER SUC SOCK COL 7IN (MISCELLANEOUS) IMPLANT
CANISTER SUCT 1200ML W/VALVE (MISCELLANEOUS) IMPLANT
CHLORAPREP W/TINT 26 (MISCELLANEOUS) ×6 IMPLANT
CLIP APPLIE 9.375 MED OPEN (MISCELLANEOUS) ×1 IMPLANT
COVER BACK TABLE 60X90IN (DRAPES) ×3 IMPLANT
COVER MAYO STAND STRL (DRAPES) ×3 IMPLANT
COVER PROBE W GEL 5X96 (DRAPES) ×3 IMPLANT
COVER WAND RF STERILE (DRAPES) IMPLANT
DECANTER SPIKE VIAL GLASS SM (MISCELLANEOUS) IMPLANT
DERMABOND ADVANCED (GAUZE/BANDAGES/DRESSINGS) ×4
DERMABOND ADVANCED .7 DNX12 (GAUZE/BANDAGES/DRESSINGS) ×2 IMPLANT
DRAPE LAPAROSCOPIC ABDOMINAL (DRAPES) ×3 IMPLANT
DRAPE LAPAROTOMY 100X72 PEDS (DRAPES) IMPLANT
DRAPE UTILITY XL STRL (DRAPES) ×3 IMPLANT
ELECT COATED BLADE 2.86 ST (ELECTRODE) ×3 IMPLANT
ELECT REM PT RETURN 9FT ADLT (ELECTROSURGICAL) ×3
ELECTRODE REM PT RTRN 9FT ADLT (ELECTROSURGICAL) ×1 IMPLANT
GLOVE BIO SURGEON STRL SZ7 (GLOVE) ×3 IMPLANT
GLOVE BIOGEL PI IND STRL 7.5 (GLOVE) ×1 IMPLANT
GLOVE BIOGEL PI IND STRL 8 (GLOVE) ×1 IMPLANT
GLOVE BIOGEL PI INDICATOR 7.5 (GLOVE) ×2
GLOVE BIOGEL PI INDICATOR 8 (GLOVE) ×2
GLOVE ECLIPSE 8.0 STRL XLNG CF (GLOVE) ×6 IMPLANT
GOWN STRL REUS W/ TWL LRG LVL3 (GOWN DISPOSABLE) ×1 IMPLANT
GOWN STRL REUS W/ TWL XL LVL3 (GOWN DISPOSABLE) ×1 IMPLANT
GOWN STRL REUS W/TWL LRG LVL3 (GOWN DISPOSABLE) ×2
GOWN STRL REUS W/TWL XL LVL3 (GOWN DISPOSABLE) ×2
HEMOSTAT ARISTA ABSORB 3G PWDR (HEMOSTASIS) IMPLANT
HEMOSTAT SNOW SURGICEL 2X4 (HEMOSTASIS) IMPLANT
KIT MARKER MARGIN INK (KITS) ×3 IMPLANT
NEEDLE HYPO 25X1 1.5 SAFETY (NEEDLE) ×3 IMPLANT
NS IRRIG 1000ML POUR BTL (IV SOLUTION) ×3 IMPLANT
PACK BASIN DAY SURGERY FS (CUSTOM PROCEDURE TRAY) ×3 IMPLANT
PENCIL BUTTON HOLSTER BLD 10FT (ELECTRODE) ×3 IMPLANT
SLEEVE SCD COMPRESS KNEE MED (MISCELLANEOUS) ×3 IMPLANT
SPONGE LAP 4X18 RFD (DISPOSABLE) ×3 IMPLANT
SUT MNCRL AB 4-0 PS2 18 (SUTURE) ×3 IMPLANT
SUT SILK 2 0 SH (SUTURE) IMPLANT
SUT VICRYL 3-0 CR8 SH (SUTURE) ×6 IMPLANT
SYR CONTROL 10ML LL (SYRINGE) ×3 IMPLANT
TOWEL GREEN STERILE FF (TOWEL DISPOSABLE) ×3 IMPLANT
TRAY FAXITRON CT DISP (TRAY / TRAY PROCEDURE) ×3 IMPLANT
TUBE CONNECTING 20'X1/4 (TUBING)
TUBE CONNECTING 20X1/4 (TUBING) IMPLANT
YANKAUER SUCT BULB TIP NO VENT (SUCTIONS) IMPLANT

## 2018-09-20 NOTE — Anesthesia Postprocedure Evaluation (Signed)
Anesthesia Post Note  Patient: Erika Gould  Procedure(s) Performed: LEFT BREAST WITH RADIOACTIVE SEED LOCALIZATION LUMPECTOMY X3 AND RIGHT BREAST RADIOACTIVE SEED LOCALIZATION LUMPECTOMY (Bilateral Breast)     Patient location during evaluation: PACU Anesthesia Type: General Level of consciousness: awake and alert Pain management: pain level controlled Vital Signs Assessment: post-procedure vital signs reviewed and stable Respiratory status: spontaneous breathing, nonlabored ventilation, respiratory function stable and patient connected to nasal cannula oxygen Cardiovascular status: blood pressure returned to baseline and stable Postop Assessment: no apparent nausea or vomiting Anesthetic complications: no    Last Vitals:  Vitals:   09/20/18 0929 09/20/18 0930  BP:    Pulse: 66 71  Resp: 15 12  Temp:    SpO2: 100% 100%    Last Pain:  Vitals:   09/20/18 0945  TempSrc:   PainSc: 8                  Erman Thum

## 2018-09-20 NOTE — Discharge Instructions (Signed)
[facility] °Office Phone Number [phone] ° °BREAST BIOPSY/ PARTIAL MASTECTOMY: POST OP INSTRUCTIONS ° °Always review your discharge instruction sheet given to you by the facility where your surgery was performed. ° °IF YOU HAVE DISABILITY OR FAMILY LEAVE FORMS, YOU MUST BRING THEM TO THE OFFICE FOR PROCESSING.  DO NOT GIVE THEM TO YOUR DOCTOR. ° °1. A prescription for pain medication may be given to you upon discharge.  Take your pain medication as prescribed, if needed.  If narcotic pain medicine is not needed, then you may take acetaminophen (Tylenol) or ibuprofen (Advil) as needed. °2. Take your usually prescribed medications unless otherwise directed °3. If you need a refill on your pain medication, please contact your pharmacy.  They will contact our office to request authorization.  Prescriptions will not be filled after 5pm or on week-ends. °4. You should eat very light the first 24 hours after surgery, such as soup, crackers, pudding, etc.  Resume your normal diet the day after surgery. °5. Most patients will experience some swelling and bruising in the breast.  Ice packs and a good support bra will help.  Swelling and bruising can take several days to resolve.  °6. It is common to experience some constipation if taking pain medication after surgery.  Increasing fluid intake and taking a stool softener will usually help or prevent this problem from occurring.  A mild laxative (Milk of Magnesia or Miralax) should be taken according to package directions if there are no bowel movements after 48 hours. °7. Unless discharge instructions indicate otherwise, you may remove your bandages 24-48 hours after surgery, and you may shower at that time.  You may have steri-strips (small skin tapes) in place directly over the incision.  These strips should be left on the skin for 7-10 days.  If your surgeon used skin glue on the incision, you may shower in 24 hours.  The glue will flake off over the  next 2-3 weeks.  Any sutures or staples will be removed at the office during your follow-up visit. °8. ACTIVITIES:  You may resume regular daily activities (gradually increasing) beginning the next day.  Wearing a good support bra or sports bra minimizes pain and swelling.  You may have sexual intercourse when it is comfortable. °a. You may drive when you no longer are taking prescription pain medication, you can comfortably wear a seatbelt, and you can safely maneuver your car and apply brakes. °b. RETURN TO WORK:  ______________________________________________________________________________________ °9. You should see your doctor in the office for a follow-up appointment approximately two weeks after your surgery.  Your doctor’s nurse will typically make your follow-up appointment when she calls you with your pathology report.  Expect your pathology report 2-3 business days after your surgery.  You may call to check if you do not hear from us after three days. °10. OTHER INSTRUCTIONS: _______________________________________________________________________________________________ _____________________________________________________________________________________________________________________________________ °_____________________________________________________________________________________________________________________________________ °_____________________________________________________________________________________________________________________________________ ° °WHEN TO CALL YOUR DOCTOR: °1. Fever over 101.0 °2. Nausea and/or vomiting. °3. Extreme swelling or bruising. °4. Continued bleeding from incision. °5. Increased pain, redness, or drainage from the incision. ° °The clinic staff is available to answer your questions during regular business hours.  Please don’t hesitate to call and ask to speak to one of the nurses for clinical concerns.  If you have a medical emergency, go to the nearest  emergency room or call 911.  A surgeon from [facility] is always on call at the hospital. ° °For further questions, please visit [email or web address]  ° ° ° ° °  Post Anesthesia Home Care Instructions ° °Activity: °Get plenty of rest for the remainder of the day. A responsible individual must stay with you for 24 hours following the procedure.  °For the next 24 hours, DO NOT: °-Drive a car °-Operate machinery °-Drink alcoholic beverages °-Take any medication unless instructed by your physician °-Make any legal decisions or sign important papers. ° °Meals: °Start with liquid foods such as gelatin or soup. Progress to regular foods as tolerated. Avoid greasy, spicy, heavy foods. If nausea and/or vomiting occur, drink only clear liquids until the nausea and/or vomiting subsides. Call your physician if vomiting continues. ° °Special Instructions/Symptoms: °Your throat may feel dry or sore from the anesthesia or the breathing tube placed in your throat during surgery. If this causes discomfort, gargle with warm salt water. The discomfort should disappear within 24 hours. ° °If you had a scopolamine patch placed behind your ear for the management of post- operative nausea and/or vomiting: ° °1. The medication in the patch is effective for 72 hours, after which it should be removed.  Wrap patch in a tissue and discard in the trash. Wash hands thoroughly with soap and water. °2. You may remove the patch earlier than 72 hours if you experience unpleasant side effects which may include dry mouth, dizziness or visual disturbances. °3. Avoid touching the patch. Wash your hands with soap and water after contact with the patch. °  ° °

## 2018-09-20 NOTE — Anesthesia Procedure Notes (Signed)
Procedure Name: LMA Insertion Date/Time: 09/20/2018 7:39 AM Performed by: Maryella Shivers, CRNA Pre-anesthesia Checklist: Patient identified, Emergency Drugs available, Suction available and Patient being monitored Patient Re-evaluated:Patient Re-evaluated prior to induction Oxygen Delivery Method: Circle system utilized Preoxygenation: Pre-oxygenation with 100% oxygen Induction Type: IV induction Ventilation: Mask ventilation without difficulty LMA: LMA inserted LMA Size: 4.0 Number of attempts: 1 Airway Equipment and Method: Bite block Placement Confirmation: positive ETCO2 Tube secured with: Tape Dental Injury: Teeth and Oropharynx as per pre-operative assessment

## 2018-09-20 NOTE — Transfer of Care (Signed)
Immediate Anesthesia Transfer of Care Note  Patient: Erika Gould  Procedure(s) Performed: LEFT BREAST WITH RADIOACTIVE SEED LOCALIZATION LUMPECTOMY X3 AND RIGHT BREAST RADIOACTIVE SEED LOCALIZATION LUMPECTOMY (Bilateral Breast)  Patient Location: PACU  Anesthesia Type:General  Level of Consciousness: sedated  Airway & Oxygen Therapy: Patient Spontanous Breathing and Patient connected to face mask oxygen  Post-op Assessment: Report given to RN and Post -op Vital signs reviewed and stable  Post vital signs: Reviewed and stable  Last Vitals:  Vitals Value Taken Time  BP 117/58 09/20/2018  9:28 AM  Temp    Pulse 71 09/20/2018  9:30 AM  Resp 12 09/20/2018  9:30 AM  SpO2 100 % 09/20/2018  9:30 AM  Vitals shown include unvalidated device data.  Last Pain:  Vitals:   09/20/18 0626  TempSrc: Oral  PainSc: 0-No pain      Patients Stated Pain Goal: 0 ([id number])  Complications: No apparent anesthesia complications

## 2018-09-20 NOTE — Op Note (Signed)
Preoperative diagnosis left breast DCIS x2 and left breast atypical ductal hyperplasia and right breast papilloma  Postoperative diagnosis: Same  Procedure: Left breast seed localized lumpectomy x3 and right breast seed localized lumpectomy x1  Surgeon: Erroll Luna, MD  Anesthesia: LMA with local consisting of 0.25% Sensorcaine local  EBL: 20 cc  Specimen for lumpectomy specimens with additional margins from the left central lumpectomy specimen all to pathology.  Drains: None  Indications for procedure: The patient is a 73 year old female with multifocal left breast DCIS plus atypical ductal hyperplasia in the left and papilloma on the right.  These were all biopsied and marked.  She wished to conserve her breast therefore multiple lumpectomies was chosen by the patient understanding potential cosmetic issues afterwards and potential need for radiation therapy and other treatments.  We also discussed potential mastectomy if her margins were not able to be cleared in a timely fashion without significant breast tissue loss.The procedure has been discussed with the patient. Alternatives to surgery have been discussed with the patient.  Risks of surgery include bleeding,  Infection,  Seroma formation, death,  and the need for further surgery.   The patient understands and wishes to proceed.   Description of procedure: The patient was met in the holding area.  Neoprobe was used to identify all 4 seeds with 3 on the left and 1 in the right.  Questions were answered.  She is taken the operative room.  She is placed supine upon the operating table.  After induction of LMA anesthesia, both breasts were prepped and draped in sterile fashion timeout was done.  She received appropriate antibiotics.  The left breast was done first.  All 3 seeds were marked with 1 medial, one central and one lateral.  Incisions were made over all 3 seed areas.  All 3 were excised with grossly negative margins.  The central  specimen clip was not identified initially.  I shaved all margins around the seed and still could not identify the clip.  Upon reviewing the films in the operating room, the seed and clip were separated.  I suspect it was dislodged and removed with 1 of the sponges.  Certainly could have been dislodged elsewhere in the breast but given the wide excision I did not try to pursue any further given her limited breast size.  All specimens were oriented with ink and sent to pathology.  Clips used to mark all 3 cavities in the left side.  All 3 wounds were closed with 3-0 Vicryl and 4-0 Monocryl.  The right side was done which consisted of papilloma disease.  Neoprobe was used to identify the lateral seed.  Incision was made around the nipple areolar complex and dissection was carried down and all tissue around the seed and clip were excised with grossly negative margin.  Local anesthetic was infiltrated in all the wounds.  The wounds were closed with 3-0 Vicryl and 4-0 Monocryl.  Dermabond applied.  All final counts found to be correct.  Breast binder was placed.  The patient was awoke extubated taken to recovery in satisfactory condition.

## 2018-09-20 NOTE — Interval H&P Note (Signed)
History and Physical Interval Note:  09/20/2018 7:20 AM  Erika Gould  has presented today for surgery, with the diagnosis of left breast dcis, and right breast papilloma.  The various methods of treatment have been discussed with the patient and family. After consideration of risks, benefits and other options for treatment, the patient has consented to  Procedure(s): LEFT BREAST WITH RADIOACTIVE SEED LOCALIZATION LUMPECTOMY X3 AND RIGHT BREAST RADIOACTIVE SEED LOCALIZATION LUMPECTOMY (Bilateral) as a surgical intervention.  The patient's history has been reviewed, patient examined, no change in status, stable for surgery.  I have reviewed the patient's chart and labs.  Questions were answered to the patient's satisfaction.     [place]

## 2018-09-21 ENCOUNTER — Encounter (HOSPITAL_BASED_OUTPATIENT_CLINIC_OR_DEPARTMENT_OTHER): Payer: Self-pay | Admitting: Surgery

## 2018-10-04 DIAGNOSIS — D0512 Intraductal carcinoma in situ of left breast: Secondary | ICD-10-CM | POA: Insufficient documentation

## 2018-10-04 NOTE — Assessment & Plan Note (Deleted)
09/20/2018: Left lumpectomy: Left central: Intermediate grade DCIS 2.5 cm, margins negative, left lateral lumpectomy: Intermediate grade DCIS 3.5 cm, involves posterior margin also focal superior, anterior and medial margins, ER 90%, PR 50% Left medial lumpectomy: Benign negative for DCIS  Pathology review: I discussed with the patient the difference between DCIS and invasive breast cancer. It is considered a precancerous lesion. DCIS is classified as a 0. It is generally detected through mammograms as calcifications. We discussed the significance of grades and its impact on prognosis. We also discussed the importance of ER and PR receptors and their implications to adjuvant treatment options. Prognosis of DCIS dependence on grade, comedo necrosis. It is anticipated that if not treated, 20-30% of DCIS can develop into invasive breast cancer.  Recommendation: 1.  Reexcision of the margins versus mastectomy 2. Followed by adjuvant radiation therapy if she undergoes breast conserving surgery 3. Followed by antiestrogen therapy with tamoxifen 5 years  Tamoxifen counseling: We discussed the risks and benefits of tamoxifen. These include but not limited to insomnia, hot flashes, mood changes, vaginal dryness, and weight gain. Although rare, serious side effects including endometrial cancer, risk of blood clots were also discussed. We strongly believe that the benefits far outweigh the risks. Patient understands these risks and consented to starting treatment. Planned treatment duration is 5 years.

## 2018-10-05 ENCOUNTER — Inpatient Hospital Stay: Payer: Medicare HMO | Admitting: Hematology and Oncology

## 2018-10-05 ENCOUNTER — Ambulatory Visit
Admission: RE | Admit: 2018-10-05 | Discharge: 2018-10-05 | Disposition: A | Discharge status: Home / Self Care | Payer: Medicare HMO | Source: Ambulatory Visit | Attending: Radiation Oncology | Admitting: Radiation Oncology

## 2018-10-05 ENCOUNTER — Ambulatory Visit: Payer: Medicare HMO

## 2018-10-05 ENCOUNTER — Telehealth: Payer: Self-pay | Admitting: Hematology and Oncology

## 2018-10-05 NOTE — Telephone Encounter (Signed)
Per staff msg from Kathlee Nations: will wait until the pt has surgery with Dr. Brantley Stage for an appt. Appt cancelled.

## 2018-10-07 ENCOUNTER — Ambulatory Visit: Payer: Self-pay | Admitting: Surgery

## 2018-10-07 DIAGNOSIS — D0512 Intraductal carcinoma in situ of left breast: Secondary | ICD-10-CM

## 2018-10-07 NOTE — H&P (Signed)
RAIANA PHARRIS Documented: 10/07/2018 8:45 AM Location: [facility] Patient #: [id number] DOB: 02/28/1946 Widowed / Language: [name] / Race: Black or African American Female  History of Present Illness Marcello Moores A. Micaiah Litle MD; 10/07/2018 8:57 AM) Patient words: Discussed on the phone the recommendation of surgery, medical oncology and radiation oncology of simple mastectomy and SLN mapping given extensive disease and multiple positive margins and small breast size. She agrees She states she is doing well and is having little pain  Incisions are not red or draining       Diagnosis 1. Breast, lumpectomy, left medial w/seed - BENIGN BREAST PARENCHYMA WITH FIBROCYSTIC CHANGE AND BIOPSY SITE CHANGES. - NEGATIVE FOR IN SITU OR INVASIVE CARCINOMA. 2. Breast, lumpectomy, left central w/seed - DUCTAL CARCINOMA IN SITU, INTERMEDIATE NUCLEAR GRADE, 2.5 CM. SEE NOTE - DCIS INVOLVES THE POSTERIOR RESECTION EDGE (FINAL POSTERIOR MARGIN IS REPRESENTED BY PART 4 WHICH IS NEGATIVE FOR CARCINOMA). - THE SUPERIOR RESECTION MARGIN IS FOCALLY POSITIVE FOR DCIS (BASED ON PART 7). - NO EVIDENCE OF INVASIVE CARCINOMA. - SMALL INTRADUCTAL PAPILLOMA. - BIOPSY SITE CHANGES. - SEE ONCOLOGY TABLE. 3. Breast, excision, Left central posterior margin - BENIGN BREAST PARENCHYMA WITH FIBROCYSTIC CHANGE. - NEGATIVE FOR IN SITU OR INVASIVE CARCINOMA. 4. Breast, excision, Left central anterior margin - BENIGN BREAST PARENCHYMA WITH FIBROCYSTIC CHANGE. - NEGATIVE FOR IN SITU OR INVASIVE CARCINOMA. 5. Breast, excision, Left central medial margin - BENIGN BREAST PARENCHYMA WITH FIBROCYSTIC CHANGE. - NEGATIVE FOR IN SITU OR INVASIVE CARCINOMA. 6. Breast, excision, Left central lateral margin - BENIGN BREAST PARENCHYMA WITH FIBROCYSTIC CHANGE AND SMALL INTRADUCTAL PAPILLOMA. - NEGATIVE FOR IN SITU OR INVASIVE CARCINOMA. 7. Breast, excision, Left central superior margin - DUCTAL CARCINOMA IN SITU,  INTERMEDIATE NUCLEAR GRADE, FOCALLY INVOLVING THE INKED NEW SUPERIOR MARGIN. 8. Breast, excision, Left central inferior margin - BENIGN BREAST PARENCHYMA WITH FIBROCYSTIC CHANGE AND INTRADUCTAL PAPILLOMA. - NEGATIVE FOR IN SITU OR INVASIVE CARCINOMA. 1 of 5 FINAL for Strang, [place] [phone]) Diagnosis(continued) 9. Breast, lumpectomy, left lateral w/seed - DUCTAL CARCINOMA IN SITU, INTERMEDIATE NUCLEAR GRADE, 3.5 CM. SEE NOTE - DCIS BROADLY INVOLVES THE POSTERIOR RESECTION MARGIN. - DCIS ALSO INVOLVES SUPERIOR, ANTERIOR AND MEDIAL MARGINS FOCALLY. - NO EVIDENCE OF INVASIVE CARCINOMA. - BIOPSY SITE CHANGES. - SEE ONCOLOGY TABLE. 10. Breast, lumpectomy, right w/seed - BENIGN BREAST PARENCHYMA WITH INTRADUCTAL PAPILLOMA AND FIBROCYSTIC CHANGE, INCLUDING USUAL DUCTAL HYPERPLASIA. - NEGATIVE FOR IN SITU OR INVASIVE CARCINOMA. Microscopic Comment 2. DCIS OF THE BREAST: Resection Procedure: Lumpectomy. Specimen Laterality: Left. Size of DCIS: 2.5 cm. Histologic Type: Cribriform. Nuclear Grade: Intermediate. Necrosis: Focal. Margins: The superior margin is focally involved by DCIS. Regional Lymph Nodes: Number of Lymph Nodes Examined: 0. Extranodal Extension: N/A. Breast Biomarker Testing Performed on Previous Biopsy: Yes. Testing Performed on Case Number: [id number]. Estrogen Receptor: 90%, strong staining intensity. Progesterone Receptor: 50%, strong staining intensity. Representative Tumor Block: 2D and 2E. Pathologic Stage Classification (pTNM, AJCC 8th Edition): pTis, pNX. (v4.2.0.0) 9. DCIS OF THE BREAST: Resection Procedure: Lumpectomy. Specimen Laterality: Left. Size of DCIS: 3.5 cm. Histologic Type: Cribriform. Nuclear Grade: Intermediate. Necrosis: Focal. Margins: The posterior margin is broadly involved by carcinoma. DCIS also focally involves anterior, superior and medial margins. Regional Lymph Nodes: Number of Lymph Nodes Examined: 0. Extranodal  Extension: N/A. Breast Biomarker Testing Performed on Previous Biopsy: Yes. Testing Performed on Case Number: [id number]. 2 of 5 FINAL for Giannattasio, Paelyn GRAVLEY [phone]) Microscopic Comment(continued) Estrogen Receptor: 90%, strong staining intensity. Progesterone Receptor: 50%, strong staining  intensity. Representative Tumor Block: 78F and 9G. Pathologic Stage Classification (pTNM, AJCC 8th Edition): pTis, pNX. (v4.2.0.0) Diagnosis Note 2. & 9. Dr. Melina Copa has reviewed this case and concurs with the above interpretation. (NK:ah 09/21/18) [name] Pathologist, Electronic Signature (Case signed 09/21/2018) Specimen Gross and Clinical Information Specimen(s) Obtained: 1. Breast, lumpectomy, left medial w/seed 2. Breast, lumpectomy, left central w/seed 3. Breast, excision, Left central posterior margin 4. Breast, excision, Left central anterior margin.  The patient is a 73 year old female.    Physical Exam (Tae Robak A. Bessy Reaney MD; 10/07/2018 8:58 AM)  General Note: telemdicine phone call    Assessment & Plan (Lacye Mccarn A. Tyrus Wilms MD; 10/07/2018 8:58 AM)  BREAST NEOPLASM, TIS (DCIS), LEFT (D05.12) Impression: left simple mastectomy and SLN mapping Discussed treatment options for breast cancer to include breast conservation vs mastectomy with reconstruction. Pt has decided on mastectomy. Risk include bleeding, infection, flap necrosis, pain, numbness, recurrence, hematoma, other surgery needs. Pt understands and agrees to proceed. Risk of sentinel lymph node mapping include bleeding, infection, lymphedema, shoulder pain. stiffness, dye allergy. cosmetic deformity , blood clots, death, need for more surgery. Pt agres to proceed.  Current Plans Pt Education - [facility] Free Text Education/Instructions: discussed with patient and provided information.

## 2018-10-19 ENCOUNTER — Telehealth: Payer: Self-pay | Admitting: *Deleted

## 2018-10-19 NOTE — Telephone Encounter (Signed)
IReceived a referral from [facility] from [facility] to reschedule this patient due to her having surgery. I just got off the phone with the patient and she did not want to reschedule due to her waiting to have more surgery.I have notified Dr. Brantley Stage and Dr. Sondra Come.

## 2018-10-21 ENCOUNTER — Telehealth: Payer: Self-pay | Admitting: Hematology and Oncology

## 2018-10-21 NOTE — Telephone Encounter (Signed)
Received a scheduling msg to reschedule Erika Gould's appt w/[name]. She has been cld and rescheduled to see Erika Gould on 5/28 at 1pm. New letter mailed.

## 2018-11-03 ENCOUNTER — Ambulatory Visit: Payer: Medicare HMO | Admitting: Radiation Oncology

## 2018-11-03 ENCOUNTER — Ambulatory Visit: Payer: Medicare HMO

## 2018-11-16 ENCOUNTER — Encounter (HOSPITAL_BASED_OUTPATIENT_CLINIC_OR_DEPARTMENT_OTHER): Payer: Self-pay | Admitting: *Deleted

## 2018-11-16 ENCOUNTER — Other Ambulatory Visit: Payer: Self-pay

## 2018-11-21 ENCOUNTER — Other Ambulatory Visit: Payer: Self-pay

## 2018-11-21 ENCOUNTER — Other Ambulatory Visit (HOSPITAL_COMMUNITY)
Admission: RE | Admit: 2018-11-21 | Discharge: 2018-11-21 | Disposition: A | Discharge status: Home / Self Care | Payer: Medicare HMO | Source: Ambulatory Visit | Attending: Surgery | Admitting: Surgery

## 2018-11-21 ENCOUNTER — Encounter (HOSPITAL_BASED_OUTPATIENT_CLINIC_OR_DEPARTMENT_OTHER)
Admission: RE | Admit: 2018-11-21 | Discharge: 2018-11-21 | Disposition: A | Discharge status: Home / Self Care | Payer: Medicare HMO | Source: Ambulatory Visit | Attending: Surgery | Admitting: Surgery

## 2018-11-21 DIAGNOSIS — Z01812 Encounter for preprocedural laboratory examination: Secondary | ICD-10-CM | POA: Diagnosis not present

## 2018-11-21 DIAGNOSIS — Z1159 Encounter for screening for other viral diseases: Secondary | ICD-10-CM | POA: Diagnosis not present

## 2018-11-21 DIAGNOSIS — I10 Essential (primary) hypertension: Secondary | ICD-10-CM | POA: Diagnosis not present

## 2018-11-21 DIAGNOSIS — Z79899 Other long term (current) drug therapy: Secondary | ICD-10-CM | POA: Diagnosis not present

## 2018-11-21 DIAGNOSIS — D0512 Intraductal carcinoma in situ of left breast: Secondary | ICD-10-CM | POA: Diagnosis not present

## 2018-11-21 DIAGNOSIS — K219 Gastro-esophageal reflux disease without esophagitis: Secondary | ICD-10-CM | POA: Diagnosis not present

## 2018-11-21 LAB — BASIC METABOLIC PANEL WITH GFR
Anion gap: 7 (ref 5–15)
BUN: 16 mg/dL (ref 8–23)
CO2: 27 mmol/L (ref 22–32)
Calcium: 10.4 mg/dL — ABNORMAL HIGH (ref 8.9–10.3)
Chloride: 105 mmol/L (ref 98–111)
Creatinine, Ser: 0.78 mg/dL (ref 0.44–1.00)
GFR calc Af Amer: >60 mL/min
GFR calc non Af Amer: >60 mL/min
Glucose, Bld: 101 mg/dL — ABNORMAL HIGH (ref 70–99)
Potassium: 4.7 mmol/L (ref 3.5–5.1)
Sodium: 139 mmol/L (ref 135–145)

## 2018-11-21 NOTE — Progress Notes (Signed)
CHG soap and instructions given. Ensure given with instructions to finish at 0545 DOS. PAT labs drawn. All questions and concerns complete.

## 2018-11-22 LAB — NOVEL CORONAVIRUS, NAA (HOSP ORDER, SEND-OUT TO REF LAB; TAT 18-24 HRS): SARS-CoV-2, NAA: NOT DETECTED

## 2018-11-23 ENCOUNTER — Encounter (HOSPITAL_BASED_OUTPATIENT_CLINIC_OR_DEPARTMENT_OTHER)
Admission: RE | Disposition: A | Discharge status: Home / Self Care | Payer: Self-pay | Source: Home / Self Care | Attending: Surgery

## 2018-11-23 ENCOUNTER — Ambulatory Visit (HOSPITAL_BASED_OUTPATIENT_CLINIC_OR_DEPARTMENT_OTHER): Payer: Medicare HMO | Admitting: Anesthesiology

## 2018-11-23 ENCOUNTER — Ambulatory Visit (HOSPITAL_COMMUNITY)
Admission: RE | Admit: 2018-11-23 | Discharge: 2018-11-23 | Disposition: A | Discharge status: Home / Self Care | Payer: Medicare HMO | Source: Ambulatory Visit | Attending: Surgery | Admitting: Surgery

## 2018-11-23 ENCOUNTER — Ambulatory Visit (HOSPITAL_BASED_OUTPATIENT_CLINIC_OR_DEPARTMENT_OTHER)
Admission: RE | Admit: 2018-11-23 | Discharge: 2018-11-24 | Disposition: A | Discharge status: Home / Self Care | Payer: Medicare HMO | Attending: Surgery | Admitting: Surgery

## 2018-11-23 ENCOUNTER — Other Ambulatory Visit: Payer: Self-pay

## 2018-11-23 ENCOUNTER — Encounter (HOSPITAL_BASED_OUTPATIENT_CLINIC_OR_DEPARTMENT_OTHER): Payer: Self-pay | Admitting: Certified Registered"

## 2018-11-23 DIAGNOSIS — K219 Gastro-esophageal reflux disease without esophagitis: Secondary | ICD-10-CM | POA: Insufficient documentation

## 2018-11-23 DIAGNOSIS — Z01812 Encounter for preprocedural laboratory examination: Secondary | ICD-10-CM | POA: Diagnosis not present

## 2018-11-23 DIAGNOSIS — G8918 Other acute postprocedural pain: Secondary | ICD-10-CM | POA: Diagnosis not present

## 2018-11-23 DIAGNOSIS — Z79899 Other long term (current) drug therapy: Secondary | ICD-10-CM | POA: Insufficient documentation

## 2018-11-23 DIAGNOSIS — D0512 Intraductal carcinoma in situ of left breast: Secondary | ICD-10-CM

## 2018-11-23 DIAGNOSIS — I10 Essential (primary) hypertension: Secondary | ICD-10-CM | POA: Insufficient documentation

## 2018-11-23 HISTORY — PX: SIMPLE MASTECTOMY WITH AXILLARY SENTINEL NODE BIOPSY: SHX6098

## 2018-11-23 SURGERY — SIMPLE MASTECTOMY WITH AXILLARY SENTINEL NODE BIOPSY
Anesthesia: General | Site: Breast | Laterality: Left

## 2018-11-23 MED ORDER — BUPIVACAINE LIPOSOME 1.3 % IJ SUSP
INTRAMUSCULAR | Status: DC | PRN
  Administered 2018-11-23: 10 mL

## 2018-11-23 MED ORDER — DEXAMETHASONE SODIUM PHOSPHATE 10 MG/ML IJ SOLN
INTRAMUSCULAR | Status: AC
  Filled 2018-11-23: qty 2

## 2018-11-23 MED ORDER — FENTANYL CITRATE (PF) 100 MCG/2ML IJ SOLN: 25.0000 ug | INTRAMUSCULAR | Status: DC | PRN

## 2018-11-23 MED ORDER — FENTANYL CITRATE (PF) 100 MCG/2ML IJ SOLN
INTRAMUSCULAR | Status: AC
  Filled 2018-11-23: qty 2

## 2018-11-23 MED ORDER — ACETAMINOPHEN 650 MG RE SUPP: 650.0000 mg | RECTAL | Status: DC | PRN

## 2018-11-23 MED ORDER — SODIUM CHLORIDE (PF) 0.9 % IJ SOLN
INTRAVENOUS | Status: DC | PRN
  Administered 2018-11-23: 10:00:00 5 mL via INTRADERMAL

## 2018-11-23 MED ORDER — SCOPOLAMINE 1 MG/3DAYS TD PT72: 1.0000 | MEDICATED_PATCH | TRANSDERMAL | Status: DC

## 2018-11-23 MED ORDER — FENTANYL CITRATE (PF) 100 MCG/2ML IJ SOLN
50.0000 ug | INTRAMUSCULAR | Status: AC | PRN
  Administered 2018-11-23: 100 ug via INTRAVENOUS
  Administered 2018-11-23 (×2): 25 ug via INTRAVENOUS

## 2018-11-23 MED ORDER — SODIUM CHLORIDE 0.9% FLUSH: 3.0000 mL | INTRAVENOUS | Status: DC | PRN

## 2018-11-23 MED ORDER — MIDAZOLAM HCL 2 MG/2ML IJ SOLN
INTRAMUSCULAR | Status: AC
  Filled 2018-11-23: qty 2

## 2018-11-23 MED ORDER — SODIUM CHLORIDE 0.9% FLUSH: 3.0000 mL | Freq: Two times a day (BID) | INTRAVENOUS | Status: DC

## 2018-11-23 MED ORDER — EPHEDRINE SULFATE 50 MG/ML IJ SOLN
INTRAMUSCULAR | Status: DC | PRN
  Administered 2018-11-23: 10 mg via INTRAVENOUS

## 2018-11-23 MED ORDER — DEXAMETHASONE SODIUM PHOSPHATE 4 MG/ML IJ SOLN
INTRAMUSCULAR | Status: DC | PRN
  Administered 2018-11-23: 4 mg via INTRAVENOUS

## 2018-11-23 MED ORDER — PROPOFOL 10 MG/ML IV BOLUS
INTRAVENOUS | Status: DC | PRN
  Administered 2018-11-23: 100 mg via INTRAVENOUS

## 2018-11-23 MED ORDER — LACTATED RINGERS IV SOLN
INTRAVENOUS | Status: DC
  Administered 2018-11-23 (×2): via INTRAVENOUS

## 2018-11-23 MED ORDER — ONDANSETRON HCL 4 MG/2ML IJ SOLN
INTRAMUSCULAR | Status: AC
  Filled 2018-11-23: qty 8

## 2018-11-23 MED ORDER — MIDAZOLAM HCL 2 MG/2ML IJ SOLN
1.0000 mg | INTRAMUSCULAR | Status: DC | PRN
  Administered 2018-11-23: 2 mg via INTRAVENOUS

## 2018-11-23 MED ORDER — ACETAMINOPHEN 325 MG PO TABS
650.0000 mg | ORAL_TABLET | ORAL | Status: DC | PRN
  Administered 2018-11-23: 650 mg via ORAL
  Filled 2018-11-23: qty 2

## 2018-11-23 MED ORDER — GABAPENTIN 300 MG PO CAPS
300.0000 mg | ORAL_CAPSULE | ORAL | Status: AC
  Administered 2018-11-23: 300 mg via ORAL

## 2018-11-23 MED ORDER — OXYCODONE HCL 5 MG PO TABS
5.0000 mg | ORAL_TABLET | ORAL | Status: DC | PRN
  Administered 2018-11-23 (×2): 5 mg via ORAL
  Filled 2018-11-23 (×2): qty 1

## 2018-11-23 MED ORDER — EPHEDRINE 5 MG/ML INJ
INTRAVENOUS | Status: AC
  Filled 2018-11-23: qty 30

## 2018-11-23 MED ORDER — PROPOFOL 500 MG/50ML IV EMUL
INTRAVENOUS | Status: DC | PRN
  Administered 2018-11-23: 25 ug/kg/min via INTRAVENOUS

## 2018-11-23 MED ORDER — CEFAZOLIN SODIUM-DEXTROSE 2-4 GM/100ML-% IV SOLN
2.0000 g | INTRAVENOUS | Status: AC
  Administered 2018-11-23: 2 g via INTRAVENOUS

## 2018-11-23 MED ORDER — CHLORHEXIDINE GLUCONATE CLOTH 2 % EX PADS: 6.0000 | MEDICATED_PAD | Freq: Once | CUTANEOUS | Status: DC

## 2018-11-23 MED ORDER — BUPIVACAINE HCL (PF) 0.25 % IJ SOLN
INTRAMUSCULAR | Status: DC | PRN
  Administered 2018-11-23: 15 mL

## 2018-11-23 MED ORDER — SODIUM CHLORIDE 0.9 % IV SOLN: 250.0000 mL | INTRAVENOUS | Status: DC | PRN

## 2018-11-23 MED ORDER — PROMETHAZINE HCL 25 MG/ML IJ SOLN: 6.2500 mg | INTRAMUSCULAR | Status: DC | PRN

## 2018-11-23 MED ORDER — GABAPENTIN 300 MG PO CAPS
ORAL_CAPSULE | ORAL | Status: AC
  Filled 2018-11-23: qty 1

## 2018-11-23 MED ORDER — SCOPOLAMINE 1 MG/3DAYS TD PT72: 1.0000 | MEDICATED_PATCH | Freq: Once | TRANSDERMAL | Status: DC | PRN

## 2018-11-23 MED ORDER — ACETAMINOPHEN 500 MG PO TABS
ORAL_TABLET | ORAL | Status: AC
  Filled 2018-11-23: qty 2

## 2018-11-23 MED ORDER — MORPHINE SULFATE (PF) 4 MG/ML IV SOLN: 1.0000 mg | INTRAVENOUS | Status: DC | PRN

## 2018-11-23 MED ORDER — IBUPROFEN 800 MG PO TABS: 800.0000 mg | ORAL_TABLET | Freq: Three times a day (TID) | ORAL | 0 refills | Status: AC | PRN

## 2018-11-23 MED ORDER — PROPOFOL 500 MG/50ML IV EMUL
INTRAVENOUS | Status: AC
  Filled 2018-11-23: qty 100

## 2018-11-23 MED ORDER — LIDOCAINE 2% (20 MG/ML) 5 ML SYRINGE
INTRAMUSCULAR | Status: AC
  Filled 2018-11-23: qty 10

## 2018-11-23 MED ORDER — HYDROCODONE-ACETAMINOPHEN 5-325 MG PO TABS: 1.0000 | ORAL_TABLET | Freq: Four times a day (QID) | ORAL | 0 refills | Status: AC | PRN

## 2018-11-23 MED ORDER — ONDANSETRON HCL 4 MG/2ML IJ SOLN
INTRAMUSCULAR | Status: DC | PRN
  Administered 2018-11-23: 4 mg via INTRAVENOUS

## 2018-11-23 MED ORDER — ACETAMINOPHEN 500 MG PO TABS
1000.0000 mg | ORAL_TABLET | ORAL | Status: AC
  Administered 2018-11-23: 1000 mg via ORAL

## 2018-11-23 MED ORDER — TECHNETIUM TC 99M SULFUR COLLOID FILTERED
1.0000 | Freq: Once | INTRAVENOUS | Status: AC | PRN
  Administered 2018-11-23: 1 via INTRADERMAL

## 2018-11-23 MED ORDER — CEFAZOLIN SODIUM-DEXTROSE 2-4 GM/100ML-% IV SOLN
INTRAVENOUS | Status: AC
  Filled 2018-11-23: qty 100

## 2018-11-23 SURGICAL SUPPLY — 55 items
APPLIER CLIP 9.375 MED OPEN (MISCELLANEOUS) ×3
BENZOIN TINCTURE PRP APPL 2/3 (GAUZE/BANDAGES/DRESSINGS) IMPLANT
BINDER BREAST LRG (GAUZE/BANDAGES/DRESSINGS) IMPLANT
BINDER BREAST MEDIUM (GAUZE/BANDAGES/DRESSINGS) IMPLANT
BINDER BREAST XLRG (GAUZE/BANDAGES/DRESSINGS) ×3 IMPLANT
BINDER BREAST XXLRG (GAUZE/BANDAGES/DRESSINGS) IMPLANT
BLADE HEX COATED 2.75 (ELECTRODE) ×3 IMPLANT
BLADE SURG 10 STRL SS (BLADE) IMPLANT
BLADE SURG 15 STRL LF DISP TIS (BLADE) ×1 IMPLANT
BLADE SURG 15 STRL SS (BLADE) ×2
CANISTER SUCT 1200ML W/VALVE (MISCELLANEOUS) ×3 IMPLANT
CHLORAPREP W/TINT 26 (MISCELLANEOUS) ×6 IMPLANT
CLIP APPLIE 9.375 MED OPEN (MISCELLANEOUS) ×1 IMPLANT
CLOSURE WOUND 1/2 X4 (GAUZE/BANDAGES/DRESSINGS)
COVER BACK TABLE REUSABLE LG (DRAPES) ×3 IMPLANT
COVER MAYO STAND REUSABLE (DRAPES) ×3 IMPLANT
COVER PROBE W GEL 5X96 (DRAPES) ×3 IMPLANT
COVER WAND RF STERILE (DRAPES) IMPLANT
DECANTER SPIKE VIAL GLASS SM (MISCELLANEOUS) IMPLANT
DERMABOND ADVANCED (GAUZE/BANDAGES/DRESSINGS) ×4
DERMABOND ADVANCED .7 DNX12 (GAUZE/BANDAGES/DRESSINGS) ×2 IMPLANT
DRAIN CHANNEL 19F RND (DRAIN) ×3 IMPLANT
DRAPE LAPAROSCOPIC ABDOMINAL (DRAPES) ×3 IMPLANT
DRAPE UTILITY XL STRL (DRAPES) ×3 IMPLANT
DRSG PAD ABDOMINAL 8X10 ST (GAUZE/BANDAGES/DRESSINGS) IMPLANT
ELECT REM PT RETURN 9FT ADLT (ELECTROSURGICAL) ×3
ELECTRODE REM PT RTRN 9FT ADLT (ELECTROSURGICAL) ×1 IMPLANT
EVACUATOR SILICONE 100CC (DRAIN) ×3 IMPLANT
GAUZE SPONGE 4X4 12PLY STRL LF (GAUZE/BANDAGES/DRESSINGS) IMPLANT
GLOVE BIOGEL PI IND STRL 8 (GLOVE) ×1 IMPLANT
GLOVE BIOGEL PI INDICATOR 8 (GLOVE) ×2
GLOVE ECLIPSE 8.0 STRL XLNG CF (GLOVE) ×3 IMPLANT
GOWN STRL REUS W/ TWL LRG LVL3 (GOWN DISPOSABLE) ×2 IMPLANT
GOWN STRL REUS W/TWL LRG LVL3 (GOWN DISPOSABLE) ×4
HEMOSTAT ARISTA ABSORB 3G PWDR (HEMOSTASIS) IMPLANT
NDL SAFETY ECLIPSE 18X1.5 (NEEDLE) IMPLANT
NEEDLE HYPO 18GX1.5 SHARP (NEEDLE)
NEEDLE HYPO 25X1 1.5 SAFETY (NEEDLE) ×6 IMPLANT
NS IRRIG 1000ML POUR BTL (IV SOLUTION) ×3 IMPLANT
PACK BASIN DAY SURGERY FS (CUSTOM PROCEDURE TRAY) ×3 IMPLANT
PENCIL BUTTON HOLSTER BLD 10FT (ELECTRODE) ×3 IMPLANT
PIN SAFETY STERILE (MISCELLANEOUS) ×3 IMPLANT
SLEEVE SCD COMPRESS KNEE MED (MISCELLANEOUS) ×3 IMPLANT
SPONGE LAP 18X18 RF (DISPOSABLE) ×9 IMPLANT
SPONGE LAP 4X18 RFD (DISPOSABLE) IMPLANT
STRIP CLOSURE SKIN 1/2X4 (GAUZE/BANDAGES/DRESSINGS) IMPLANT
SUT ETHILON 2 0 FS 18 (SUTURE) ×3 IMPLANT
SUT MNCRL AB 4-0 PS2 18 (SUTURE) ×3 IMPLANT
SUT SILK 2 0 SH (SUTURE) ×3 IMPLANT
SUT VICRYL 3-0 CR8 SH (SUTURE) ×6 IMPLANT
SYR CONTROL 10ML LL (SYRINGE) IMPLANT
TOWEL GREEN STERILE FF (TOWEL DISPOSABLE) ×3 IMPLANT
TUBE CONNECTING 20'X1/4 (TUBING) ×1
TUBE CONNECTING 20X1/4 (TUBING) ×2 IMPLANT
YANKAUER SUCT BULB TIP NO VENT (SUCTIONS) ×3 IMPLANT

## 2018-11-23 NOTE — Discharge Instructions (Signed)
[facility] °[phone] ° °MASTECTOMY: POST OP INSTRUCTIONS ° °Always review your discharge instruction sheet given to you by the facility where your surgery was performed. °IF YOU HAVE DISABILITY OR FAMILY LEAVE FORMS, YOU MUST BRING THEM TO THE OFFICE FOR PROCESSING.   °DO NOT GIVE THEM TO YOUR DOCTOR. °A prescription for pain medication may be given to you upon discharge.  Take your pain medication as prescribed, if needed.  If narcotic pain medicine is not needed, then you may take acetaminophen (Tylenol) or ibuprofen (Advil) as needed. °1. Take your usually prescribed medications unless otherwise directed. °2. If you need a refill on your pain medication, please contact your pharmacy.  They will contact our office to request authorization.  Prescriptions will not be filled after 5pm or on week-ends. °3. You should follow a light diet the first few days after arrival home, such as soup and crackers, etc.  Resume your normal diet the day after surgery. °4. Most patients will experience some swelling and bruising on the chest and underarm.  Ice packs will help.  Swelling and bruising can take several days to resolve.  °5. It is common to experience some constipation if taking pain medication after surgery.  Increasing fluid intake and taking a stool softener (such as Colace) will usually help or prevent this problem from occurring.  A mild laxative (Milk of Magnesia or Miralax) should be taken according to package instructions if there are no bowel movements after 48 hours. °6. Unless discharge instructions indicate otherwise, leave your bandage dry and in place until your next appointment in 3-5 days.  You may take a limited sponge bath.  No tube baths or showers until the drains are removed.  You may have steri-strips (small skin tapes) in place directly over the incision.  These strips should be left on the skin for 7-10 days.  If your surgeon used skin glue on the incision, you may  shower in 24 hours.  The glue will flake off over the next 2-3 weeks.  Any sutures or staples will be removed at the office during your follow-up visit. °7. DRAINS:  If you have drains in place, it is important to keep a list of the amount of drainage produced each day in your drains.  Before leaving the hospital, you should be instructed on drain care.  Call our office if you have any questions about your drains. °8. ACTIVITIES:  You may resume regular (light) daily activities beginning the next day--such as daily self-care, walking, climbing stairs--gradually increasing activities as tolerated.  You may have sexual intercourse when it is comfortable.  Refrain from any heavy lifting or straining until approved by your doctor. °a. You may drive when you are no longer taking prescription pain medication, you can comfortably wear a seatbelt, and you can safely maneuver your car and apply brakes. °b. RETURN TO WORK:  __________________________________________________________ °9. You should see your doctor in the office for a follow-up appointment approximately 3-5 days after your surgery.  Your doctor’s nurse will typically make your follow-up appointment when she calls you with your pathology report.  Expect your pathology report 2-3 business days after your surgery.  You may call to check if you do not hear from us after three days.   °10. OTHER INSTRUCTIONS: ______________________________________________________________________________________________ ____________________________________________________________________________________________ °WHEN TO CALL YOUR DOCTOR: °1. Fever over 101.0 °2. Nausea and/or vomiting °3. Extreme swelling or bruising °4. Continued bleeding from incision. °5. Increased pain, redness, or drainage from the incision. °  The clinic staff is available to answer your questions during regular business hours.  Please don’t hesitate to call and ask to speak to one of the nurses for clinical  concerns.  If you have a medical emergency, go to the nearest emergency room or call 911.  A surgeon from [facility] is always on call at the hospital. °[address], [address], [place], [place]  [address] ? [address] [address], [place], [place]   [address] °[phone] ? [phone] ? FAX [phone] °Web site: www.[facility] °[facility] °Surgical drains are used to remove extra fluid that normally builds up in a surgical wound after surgery. A surgical drain helps to heal a surgical wound. Different kinds of surgical drains include: °· Active drains. These drains use suction to pull drainage away from the surgical wound. Drainage flows through a tube to a container outside of the body. It is important to keep the bulb or the drainage container flat (compressed) at all times, except while you empty it. Flattening the bulb or container creates suction. The two most common types of active drains are bulb drains and Hemovac drains. °· Passive drains. These drains allow fluid to drain naturally, by gravity. Drainage flows through a tube to a bandage (dressing) or a container outside of the body. Passive drains do not need to be emptied. The most common type of passive drain is the Penrose drain. °A drain is placed during surgery. Immediately after surgery, drainage is usually bright red and a little thicker than water. The drainage may gradually turn yellow or pink and become thinner. It is likely that your health care provider will remove the drain when the drainage stops or when the amount decreases to 1-2 Tbsp (15-30 mL) during a 24-hour period. °How to care for your surgical drain °It is important to care for your drain to prevent infection. If your drain is placed at your back, or any other hard-to-reach area, ask another person to assist you in performing the following steps: °· Keep the skin around the drain dry and covered with a dressing at all times. °· Check your drain area every  day for signs of infection. Check for: °? More redness, swelling, or pain. °? Pus or a bad smell. °? Cloudy drainage. °Follow instructions from your health care provider about how to take care of your drain and how to change your dressing. Change your dressing at least one time every day. Change it more often if needed to keep the dressing dry. Make sure you: °1. Gather your supplies, including: °? Tape. °? Germ-free cleaning solution (sterile saline). °? Split gauze drain sponge: 4 x 4 inches (10 x 10 cm). °? Gauze square: 4 x 4 inches (10 x 10 cm). °2. Wash your hands with soap and water before you change your dressing. If soap and water are not available, use hand sanitizer. °3. Remove the old dressing. Avoid using scissors to do that. °4. Use sterile saline to clean your skin around the drain. °5. Place the tube through the slit in a drain sponge. Place the drain sponge so that it covers your wound. °6. Place the gauze square or another drain sponge on top of the drain sponge that is on the wound. Make sure the tube is between those layers. °7. Tape the dressing to your skin. °8. If you have an active bulb or Hemovac drain, tape the drainage tube to your skin 1-2 inches (2.5-5 cm) below the place where   the tube enters your body. Taping keeps the tube from pulling on any stitches (sutures) that you have. 9. Wash your hands with soap and water. 10. Write down the color of your drainage and how often you change your dressing. How to empty your active bulb or Hemovac drain  1. Make sure that you have a measuring cup that you can empty your drainage into. 2. Wash your hands with soap and water. If soap and water are not available, use hand sanitizer. 3. Gently move your fingers down the tube while squeezing very lightly. This is called stripping the tube. This clears any drainage, clots, or tissue from the tube. ? Do not pull on the tube. ? You may need to strip the tube several times every day to keep the  tube clear. 4. Open the bulb cap or the drain plug. Do not touch the inside of the cap or the bottom of the plug. 5. Empty all of the drainage into the measuring cup. 6. Compress the bulb or the container and replace the cap or the plug. To compress the bulb or the container, squeeze it firmly in the middle while you close the cap or plug the container. 7. Write down the amount of drainage that you have in each 24-hour period. If you have less than 2 Tbsp (30 mL) of drainage during 24 hours, contact your health care provider. 8. Flush the drainage down the toilet. 9. Wash your hands with soap and water. Contact a health care provider if:  You have more redness, swelling, or pain around your drain area.  The amount of drainage that you have is increasing instead of decreasing.  You have pus or a bad smell coming from your drain area.  You have a fever.  You have drainage that is cloudy.  There is a sudden stop or a sudden decrease in the amount of drainage that you have.  Your tube falls out.  Your active draindoes not stay compressedafter you empty it. Summary  Surgical drains are used to remove extra fluid that normally builds up in a surgical wound after surgery.  Different kinds of surgical drains include active drains and passive drains. Active drains use suction to pull drainage away from the surgical wound, and passive drains allow fluid to drain naturally.  It is important to care for your drain to prevent infection. If your drain is placed at your back, or any other hard-to-reach area, ask another person to assist you.  Contact your health care provider if you have more redness, swelling, or pain around your drain area. This information is not intended to replace advice given to you by your health care provider. Make sure you discuss any questions you have with your health care provider. Document Released: 06/19/2000 Document Revised: 07/15/2017 Document Reviewed:  01/09/2015 Elsevier Interactive Patient Education  2019 [place] [facility] Instructions  Activity: Get plenty of rest for the remainder of the day. A responsible individual must stay with you for 24 hours following the procedure.  For the next 24 hours, DO NOT: -Drive a car -Paediatric nurse -Drink alcoholic beverages -Take any medication unless instructed by your physician -Make any legal decisions or sign important papers.  Meals: Start with liquid foods such as gelatin or soup. Progress to regular foods as tolerated. Avoid greasy, spicy, heavy foods. If nausea and/or vomiting occur, drink only clear liquids until the nausea and/or vomiting subsides. Call your physician if vomiting continues.  Special Instructions/Symptoms: Your throat may feel dry or sore from the anesthesia or the breathing tube placed in your throat during surgery. If this causes discomfort, gargle with warm salt water. The discomfort should disappear within 24 hours.  If you had a scopolamine patch placed behind your ear for the management of post- operative nausea and/or vomiting:  1. The medication in the patch is effective for 72 hours, after which it should be removed.  Wrap patch in a tissue and discard in the trash. Wash hands thoroughly with soap and water. 2. You may remove the patch earlier than 72 hours if you experience unpleasant side effects which may include dry mouth, dizziness or visual disturbances. 3. Avoid touching the patch. Wash your hands with soap and water after contact with the patch.    About my Jackson-Pratt Bulb Drain  What is a Jackson-Pratt bulb? A Jackson-Pratt is a soft, round device used to collect drainage. It is connected to a long, thin drainage catheter, which is held in place by one or two small stiches near your surgical incision site. When the bulb is squeezed, it forms a vacuum, forcing the drainage to empty into the bulb.  Emptying the  Jackson-Pratt bulb- To empty the bulb: 1. Release the plug on the top of the bulb. 2. Pour the bulb's contents into a measuring container which your nurse will provide. 3. Record the time emptied and amount of drainage. Empty the drain(s) as often as your     doctor or nurse recommends.  Date                  Time                    Amount (Drain 1)                 Amount (Drain 2)  _____________________________________________________________________  _____________________________________________________________________  _____________________________________________________________________  _____________________________________________________________________  _____________________________________________________________________  _____________________________________________________________________  _____________________________________________________________________  _____________________________________________________________________  Squeezing the Jackson-Pratt Bulb- To squeeze the bulb: 1. Make sure the plug at the top of the bulb is open. 2. Squeeze the bulb tightly in your fist. You will hear air squeezing from the bulb. 3. Replace the plug while the bulb is squeezed. 4. Use a safety pin to attach the bulb to your clothing. This will keep the catheter from     pulling at the bulb insertion site.  When to call your doctor- Call your doctor if:  Drain site becomes red, swollen or hot.  You have a fever greater than 101 degrees F.  There is oozing at the drain site.  Drain falls out (apply a guaze bandage over the drain hole and secure it with tape).  Drainage increases daily not related to activity patterns. (You will usually have more drainage when you are active than when you are resting.)  Drainage has a bad odor.

## 2018-11-23 NOTE — Progress Notes (Signed)
Nuc med injection performed by nuc med staff. Pt tol well with no additional medation. VSS. Emotional support provided.

## 2018-11-23 NOTE — Anesthesia Preprocedure Evaluation (Addendum)
Anesthesia Evaluation  Patient identified by MRN, date of birth, ID band Patient awake    Reviewed: Allergy & Precautions, H&P , NPO status , Patient's Chart, lab work & pertinent test results, reviewed documented beta blocker date and time   History of Anesthesia Complications Negative for: history of anesthetic complications  Airway Mallampati: I  TM Distance: >3 FB Neck ROM: Full    Dental no notable dental hx. (+) Teeth Intact,    Pulmonary neg pulmonary ROS,    Pulmonary exam normal breath sounds clear to auscultation       Cardiovascular Exercise Tolerance: Good hypertension, Pt. on medications and Pt. on home beta blockers negative cardio ROS Normal cardiovascular exam Rhythm:Regular Rate:Normal  EKG 2/20 Sinus bradycardia with Premature atrial complexes Minimal voltage criteria for LVH, may be normal variant   Neuro/Psych negative neurological ROS  negative psych ROS   GI/Hepatic negative GI ROS, Neg liver ROS, GERD  Medicated,  Endo/Other  negative endocrine ROS  Renal/GU negative Renal ROS  negative genitourinary   Musculoskeletal negative musculoskeletal ROS (+)   Abdominal   Peds negative pediatric ROS (+)  Hematology negative hematology ROS (+)   Anesthesia Other Findings   Reproductive/Obstetrics negative OB ROS                            Anesthesia Physical  Anesthesia Plan  ASA: II  Anesthesia Plan: General   Post-op Pain Management:  Regional for Post-op pain   Induction: Intravenous  PONV Risk Score and Plan: Ondansetron, Treatment may vary due to age or medical condition, Dexamethasone and Scopolamine patch - Pre-op  Airway Management Planned: Oral ETT and LMA  Additional Equipment:   Intra-op Plan:   Post-operative Plan: Extubation in OR  Informed Consent: I have reviewed the patients History and Physical, chart, labs and discussed the procedure  including the risks, benefits and alternatives for the proposed anesthesia with the patient or authorized representative who has indicated his/her understanding and acceptance.     Dental advisory given  Plan Discussed with: CRNA, Anesthesiologist and Surgeon  Anesthesia Plan Comments: (  )        Anesthesia Quick Evaluation

## 2018-11-23 NOTE — Transfer of Care (Signed)
Immediate Anesthesia Transfer of Care Note  Patient: Erika Gould  Procedure(s) Performed: LEFT SIMPLE MASTECTOMY WITH  SENTINEL LYMPH NODE MAPPING (Left Breast)  Patient Location: PACU  Anesthesia Type:GA combined with regional for post-op pain  Level of Consciousness: awake, alert , oriented and patient cooperative  Airway & Oxygen Therapy: Patient Spontanous Breathing and Patient connected to nasal cannula oxygen  Post-op Assessment: Report given to RN and Post -op Vital signs reviewed and stable  Post vital signs: Reviewed and stable  Last Vitals:  Vitals Value Taken Time  BP 111/56 11/23/2018 11:26 AM  Temp    Pulse 69 11/23/2018 11:27 AM  Resp 19 11/23/2018 11:27 AM  SpO2 100 % 11/23/2018 11:27 AM  Vitals shown include unvalidated device data.  Last Pain:  Vitals:   11/23/18 0822  TempSrc: Oral  PainSc: 0-No pain         Complications: No apparent anesthesia complications

## 2018-11-23 NOTE — Progress Notes (Signed)
Assisted Dr. Singer with left, ultrasound guided, pectoralis block. Side rails up, monitors on throughout procedure. See vital signs in flow sheet. Tolerated Procedure well. °

## 2018-11-23 NOTE — H&P (Signed)
Erika Gould  Location: [facility] Patient #: [id number] DOB: March 22, 1946 Widowed / Language: [name] / Race: Black or African American Female  History of Present Illness  Patient words: Discussed on the phone the recommendation of surgery, medical oncology and radiation oncology of simple mastectomy and SLN mapping given extensive disease and multiple positive margins and small breast size. She agrees She states she is doing well and is having little pain  Incisions are not red or draining       Diagnosis 1. Breast, lumpectomy, left medial w/seed - BENIGN BREAST PARENCHYMA WITH FIBROCYSTIC CHANGE AND BIOPSY SITE CHANGES. - NEGATIVE FOR IN SITU OR INVASIVE CARCINOMA. 2. Breast, lumpectomy, left central w/seed - DUCTAL CARCINOMA IN SITU, INTERMEDIATE NUCLEAR GRADE, 2.5 CM. SEE NOTE - DCIS INVOLVES THE POSTERIOR RESECTION EDGE (FINAL POSTERIOR MARGIN IS REPRESENTED BY PART 4 WHICH IS NEGATIVE FOR CARCINOMA). - THE SUPERIOR RESECTION MARGIN IS FOCALLY POSITIVE FOR DCIS (BASED ON PART 7). - NO EVIDENCE OF INVASIVE CARCINOMA. - SMALL INTRADUCTAL PAPILLOMA. - BIOPSY SITE CHANGES. - SEE ONCOLOGY TABLE. 3. Breast, excision, Left central posterior margin - BENIGN BREAST PARENCHYMA WITH FIBROCYSTIC CHANGE. - NEGATIVE FOR IN SITU OR INVASIVE CARCINOMA. 4. Breast, excision, Left central anterior margin - BENIGN BREAST PARENCHYMA WITH FIBROCYSTIC CHANGE. - NEGATIVE FOR IN SITU OR INVASIVE CARCINOMA. 5. Breast, excision, Left central medial margin - BENIGN BREAST PARENCHYMA WITH FIBROCYSTIC CHANGE. - NEGATIVE FOR IN SITU OR INVASIVE CARCINOMA. 6. Breast, excision, Left central lateral margin - BENIGN BREAST PARENCHYMA WITH FIBROCYSTIC CHANGE AND SMALL INTRADUCTAL PAPILLOMA. - NEGATIVE FOR IN SITU OR INVASIVE CARCINOMA. 7. Breast, excision, Left central superior margin - DUCTAL CARCINOMA IN SITU, INTERMEDIATE NUCLEAR GRADE, FOCALLY INVOLVING THE INKED NEW  SUPERIOR MARGIN. 8. Breast, excision, Left central inferior margin - BENIGN BREAST PARENCHYMA WITH FIBROCYSTIC CHANGE AND INTRADUCTAL PAPILLOMA. - NEGATIVE FOR IN SITU OR INVASIVE CARCINOMA. 1 of 5 FINAL for Erika Gould, Erika Gould [phone]) Diagnosis(continued) 9. Breast, lumpectomy, left lateral w/seed - DUCTAL CARCINOMA IN SITU, INTERMEDIATE NUCLEAR GRADE, 3.5 CM. SEE NOTE - DCIS BROADLY INVOLVES THE POSTERIOR RESECTION MARGIN. - DCIS ALSO INVOLVES SUPERIOR, ANTERIOR AND MEDIAL MARGINS FOCALLY. - NO EVIDENCE OF INVASIVE CARCINOMA. - BIOPSY SITE CHANGES. - SEE ONCOLOGY TABLE. 10. Breast, lumpectomy, right w/seed - BENIGN BREAST PARENCHYMA WITH INTRADUCTAL PAPILLOMA AND FIBROCYSTIC CHANGE, INCLUDING USUAL DUCTAL HYPERPLASIA. - NEGATIVE FOR IN SITU OR INVASIVE CARCINOMA. Microscopic Comment 2. DCIS OF THE BREAST: Resection Procedure: Lumpectomy. Specimen Laterality: Left. Size of DCIS: 2.5 cm. Histologic Type: Cribriform. Nuclear Grade: Intermediate. Necrosis: Focal. Margins: The superior margin is focally involved by DCIS. Regional Lymph Nodes: Number of Lymph Nodes Examined: 0. Extranodal Extension: N/A. Breast Biomarker Testing Performed on Previous Biopsy: Yes. Testing Performed on Case Number: [id number]. Estrogen Receptor: 90%, strong staining intensity. Progesterone Receptor: 50%, strong staining intensity. Representative Tumor Block: 2D and 2E. Pathologic Stage Classification (pTNM, AJCC 8th Edition): pTis, pNX. (v4.2.0.0) 9. DCIS OF THE BREAST: Resection Procedure: Lumpectomy. Specimen Laterality: Left. Size of DCIS: 3.5 cm. Histologic Type: Cribriform. Nuclear Grade: Intermediate. Necrosis: Focal. Margins: The posterior margin is broadly involved by carcinoma. DCIS also focally involves anterior, superior and medial margins. Regional Lymph Nodes: Number of Lymph Nodes Examined: 0. Extranodal Extension: N/A. Breast Biomarker Testing Performed on  Previous Biopsy: Yes. Testing Performed on Case Number: [id number]. 2 of 5 FINAL for Erika Gould, Erika Gould [phone]) Microscopic Comment(continued) Estrogen Receptor: 90%, strong staining intensity. Progesterone Receptor: 50%, strong staining intensity. Representative Tumor Block: 18F and 9G. Pathologic Stage  Classification (pTNM, AJCC 8th Edition): pTis, pNX. (v4.2.0.0) Diagnosis Note 2. & 9. Dr. Melina Copa has reviewed this case and concurs with the above interpretation. (NK:ah 09/21/18) [name] Pathologist, Electronic Signature (Case signed 09/21/2018) Specimen Gross and Clinical Information Specimen(s) Obtained: 1. Breast, lumpectomy, left medial w/seed 2. Breast, lumpectomy, left central w/seed 3. Breast, excision, Left central posterior margin 4. Breast, excision, Left central anterior margin.  The patient is a 73 year old female.    Physical Exam   General Note: telemdicine phone call    Assessment & Plan   BREAST NEOPLASM, TIS (DCIS), LEFT (D05.12) Impression: left simple mastectomy and SLN mapping Discussed treatment options for breast cancer to include breast conservation vs mastectomy with reconstruction. Pt has decided on mastectomy. Risk include bleeding, infection, flap necrosis, pain, numbness, recurrence, hematoma, other surgery needs. Pt understands and agrees to proceed. Risk of sentinel lymph node mapping include bleeding, infection, lymphedema, shoulder pain. stiffness, dye allergy. cosmetic deformity , blood clots, death, need for more surgery. Pt agres to proceed.

## 2018-11-23 NOTE — Op Note (Signed)
KEYUANA WANK [facility] Mar 19, 1946 [id number] 11/23/2018   Preoperative diagnosis: Left breast multifocal diffuse DCIS  Postoperative diagnosis: Same  Procedure: Left simple mastectomy with left axillary sentinel lymph node mapping of deep left axillary sentinel nodes using methylene blue dye  Surgeon: Turner Daniels MD  Assistant surgeon: Dalbert Batman MD  Anesthesia:General and Regional  Clinical History and Indications:The patient is seen in the holding area and we reviewed the plans for the procedure as noted above. We reviewed the risks and complications a final time. She had no further questions. I marked theleft side as the operative side.The surgical and non surgical options have been discussed with the patient.  Risks of surgery include bleeding,  Infection,  Flap necrosis,  Tissue loss,  Chronic pain, death, Numbness,  And the need for additional procedures.  Reconstruction options also have been discussed with the patient as well.  The patient agrees to proceed.  Description of Procedure: The patient was taken to the operating room. After satisfactory general anesthesia was obtained the timeout was done.   I then injected 5 cc of dilute methylene blue and injected it subareaorly and massaged it in. A full prep and drape was then done.  I outlined an elliptical incision and marked the inframammary fold and midline. The incision was made. The usual skin flaps were raised. I went to the clavicle superiorly and sternum medially and towards the latissimus laterally.    After the superior flap was complete I was able to use the neoprobe to locate a sentinel node.  3 left axillary level 1 sentinel nodes were identified and were all blue and weakly positive by technetium.  These were removed and sent to pathology.  Once the node was removed it was forwarded to pathology.   The inferior flap was then made going to the inframammary fold and out to the latissimus. The breast was then removed  from the pectoralis starting medially and working laterally. When I got to the lateral aspect of the pectoralis major muscle I opened the clavipectoral fascia. I finished removing the breast from the serratus muscles.    I therefore completed the mastectomy by dividing the remaining attachments from the breast to the chest wall and latissimus but not taking any more tissue from the axilla. The specimen was marked to orient it for the pathologist and passed off the table.  I spent several minutes irrigating and making sure everything was dry. I then placed a 76 [place] Blake drain through a small incision in the inferior flap and laid along the pectoralis muscle. It was secured with a 2-0 nylon suture.  Another irrigation and check for hemostasis was made. Everything appeared to be dry. Incision was then closed with 3-0 Vicryl and 4-0 Monocryl.  The patient tolerated the procedure well. There were no operative complications. All counts were correct. Estimated blood loss was 75 cc. Sterile dressings were applied and the patient taken to the PACU in satisfactory condition.  Erroll Luna, MD, FACS 11/23/2018 11:24 AM

## 2018-11-23 NOTE — Anesthesia Postprocedure Evaluation (Signed)
Anesthesia Post Note  Patient: Erika Gould  Procedure(s) Performed: LEFT SIMPLE MASTECTOMY WITH  SENTINEL LYMPH NODE MAPPING (Left Breast)     Patient location during evaluation: PACU Anesthesia Type: General Level of consciousness: awake and alert Pain management: pain level controlled Vital Signs Assessment: post-procedure vital signs reviewed and stable Respiratory status: spontaneous breathing, nonlabored ventilation and respiratory function stable Cardiovascular status: blood pressure returned to baseline and stable Postop Assessment: no apparent nausea or vomiting Anesthetic complications: no    Last Vitals:  Vitals:   11/23/18 1145 11/23/18 1200  BP: 123/68 (P) 121/65  Pulse: 66 67  Resp: 19 16  Temp:    SpO2: 100% 95%    Last Pain:  Vitals:   11/23/18 1126  TempSrc:   PainSc: [place]

## 2018-11-23 NOTE — Anesthesia Procedure Notes (Signed)
Anesthesia Regional Block: Pectoralis block   Pre-Anesthetic Checklist: ,, timeout performed, Correct Patient, Correct Site, Correct Laterality, Correct Procedure, Correct Position, site marked, Risks and benefits discussed,  Surgical consent,  Pre-op evaluation,  At surgeon's request and post-op pain management  Laterality: Left  Prep: chloraprep       Needles:  Injection technique: Single-shot  Needle Type: Echogenic Stimulator Needle     Needle Length: 10cm  Needle Gauge: 21     Additional Needles:   Narrative:  Start time: 11/23/2018 9:06 AM End time: 11/23/2018 9:16 AM Injection made incrementally with aspirations every 5 mL.  Performed by: Personally

## 2018-11-23 NOTE — Anesthesia Procedure Notes (Signed)
Procedure Name: LMA Insertion Date/Time: 11/23/2018 9:50 AM Performed by: Signe Colt, CRNA Pre-anesthesia Checklist: Patient identified, Emergency Drugs available, Suction available and Patient being monitored Patient Re-evaluated:Patient Re-evaluated prior to induction Oxygen Delivery Method: Circle system utilized Preoxygenation: Pre-oxygenation with 100% oxygen Induction Type: IV induction Ventilation: Mask ventilation without difficulty LMA: LMA inserted LMA Size: 4.0 Number of attempts: 1 Airway Equipment and Method: Bite block Placement Confirmation: positive ETCO2 Tube secured with: Tape Dental Injury: Teeth and Oropharynx as per pre-operative assessment

## 2018-11-23 NOTE — Interval H&P Note (Signed)
History and Physical Interval Note:  11/23/2018 9:36 AM  Erika Gould  has presented today for surgery, with the diagnosis of LEFT DCIS.  The various methods of treatment have been discussed with the patient and family. After consideration of risks, benefits and other options for treatment, the patient has consented to  Procedure(s): LEFT SIMPLE MASTECTOMY WITH  SENTINEL [place] (Left) as a surgical intervention.  The patient's history has been reviewed, patient examined, no change in status, stable for surgery.  I have reviewed the patient's chart and labs.  Questions were answered to the patient's satisfaction.     [place]

## 2018-11-24 ENCOUNTER — Encounter (HOSPITAL_BASED_OUTPATIENT_CLINIC_OR_DEPARTMENT_OTHER): Payer: Self-pay | Admitting: Surgery

## 2018-11-24 MED ORDER — HYDROCODONE-ACETAMINOPHEN 5-325 MG PO TABS: 1.0000 | ORAL_TABLET | Freq: Four times a day (QID) | ORAL | 0 refills | Status: AC | PRN

## 2018-11-24 NOTE — Discharge Summary (Signed)
Physician Discharge Summary  Patient ID: Erika Gould MRN: [id number] DOB/AGE: 10-04-1945 73 y.o.  Admit date: 11/23/2018 Discharge date: 11/24/2018  Admission Diagnoses: DCIS left breast  Discharge Diagnoses:  Active Problems:   Ductal carcinoma in situ (DCIS) of left breast   Discharged Condition: good  Hospital Course: Pt did well overnight.  She had good pain control, tolerated her diet ,had appropriate drainage and ambulated.       Treatments: surgery: left mastectomy and SLN mapping   Discharge Exam: Blood pressure 114/60, pulse 71, temperature (!) 97.4 F (36.3 C), resp. rate 16, height 5\' 6"  (1.676 m), weight 85.6 kg, SpO2 100 %. General appearance: alert and cooperative Head: Normocephalic, without obvious abnormality, atraumatic Breasts: left mastectomy wound CDI no hematoma  flaps viable   Disposition: Discharge disposition: 01-Home or Self Care       Discharge Instructions    Diet - low sodium heart healthy   Complete by:  As directed    Diet - low sodium heart healthy   Complete by:  As directed    Increase activity slowly   Complete by:  As directed    Increase activity slowly   Complete by:  As directed      Allergies as of 11/24/2018      Reactions   Codeine Nausea Only      Medication List    TAKE these medications   atenolol 50 MG tablet Commonly known as:  TENORMIN Take 50 mg by mouth daily.   HYDROcodone-acetaminophen 5-325 MG tablet Commonly known as:  NORCO/VICODIN Take 1 tablet by mouth every 6 (six) hours as needed for moderate pain. What changed:  Another medication with the same name was added. Make sure you understand how and when to take each.   HYDROcodone-acetaminophen 5-325 MG tablet Commonly known as:  NORCO/VICODIN Take 1 tablet by mouth every 6 (six) hours as needed for moderate pain. What changed:  You were already taking a medication with the same name, and this prescription was added. Make sure you  understand how and when to take each.   ibuprofen 800 MG tablet Commonly known as:  ADVIL Take 1 tablet (800 mg total) by mouth every 8 (eight) hours as needed.   lisinopril-hydrochlorothiazide 20-12.5 MG tablet Commonly known as:  ZESTORETIC Take 1 tablet by mouth daily.   multivitamin with minerals Tabs tablet Take 1 tablet by mouth daily.   omeprazole 20 MG capsule Commonly known as:  PRILOSEC Take 20 mg by mouth daily.        Signed: Joyice Faster Muath Hallam 11/24/2018, 6:43 AM

## 2018-11-30 NOTE — Progress Notes (Signed)
[facility] CONSULT NOTE  Patient Care Team: Glenda Chroman, MD as PCP - General (Internal Medicine)  CHIEF COMPLAINTS/PURPOSE OF CONSULTATION: Newly diagnosed breast cancer  HISTORY OF PRESENTING ILLNESS:  Erika Gould 73 y.o. female is here because of recent diagnosis of ductal carcinoma in situ of the left breast. The cancer was detected on a routine screening mammogram. Diagnostic mammogram showed multiple areas of microcalcification. A biopsy of the upper outer left breast on 08/02/18 showed high grade DCIS, ER 95%, PR 50%.  Biopsy of the upper inner left breast on 08/09/18 showed atypical ductal hyperplasia with calcifications. Biopsy of the right breast on 08/16/18 showed ductal papilloma with calcifications. Bilateral lumpectomies on 09/20/18 confirmed 2.5cm and 3.5cm intermediate grade DCIS in the left breast and benign findings in the right breast with involved margins. Due to involved margins, extent of disease, and small breast size, she underwent a left mastectomy on 11/23/18 which confirmed 1.6cm intermediate grade DCIS, with clear margins and all 4 sentinel lymph nodes negative for carcinoma.   She presents to the clinic today for initial evaluation. She denies any family history of breast cancer.   I reviewed her records extensively and collaborated the history with the patient.  SUMMARY OF ONCOLOGIC HISTORY:   Ductal carcinoma in situ (DCIS) of left breast   08/04/2018 Initial Diagnosis    Screening mammogram detected bilateral breast abnormalities.  2 biopsies on the left revealed high-grade DCIS ER 90%, PR 50%, right breast biopsy revealed papillomatosis, Tis NX stage 0    09/20/2018 Surgery    Left lumpectomy: Left central: Intermediate grade DCIS 2.5 cm, margins negative, left lateral lumpectomy: Intermediate grade DCIS 3.5 cm, involves posterior margin also focal superior, anterior and medial margins, ER 90%, PR 50% Left medial lumpectomy: Benign  negative for DCIS    11/23/2018 Surgery    Left mastectomy ([facility]): DCIS, intermediate grade, 1.5cm, ER+ 95%, PR+ 50%, clear margins, 4 SLN negative for carcinoma.       MEDICAL HISTORY:  Past Medical History:  Diagnosis Date  . Cancer ([place]) 08/2018   bil DCIS  . GERD (gastroesophageal reflux disease)   . Hypertension     SURGICAL HISTORY: Past Surgical History:  Procedure Laterality Date  . BLADDER SURGERY    . BREAST LUMPECTOMY WITH RADIOACTIVE SEED LOCALIZATION Bilateral 09/20/2018   Procedure: LEFT BREAST WITH RADIOACTIVE SEED LOCALIZATION LUMPECTOMY X3 AND RIGHT BREAST RADIOACTIVE SEED LOCALIZATION LUMPECTOMY;  Surgeon: Erroll Luna, MD;  Location: [facility];  Service: General;  Laterality: Bilateral;  . SIMPLE MASTECTOMY WITH AXILLARY SENTINEL NODE BIOPSY Left 11/23/2018   Procedure: LEFT SIMPLE MASTECTOMY WITH  SENTINEL LYMPH NODE MAPPING;  Surgeon: Erroll Luna, MD;  Location: [facility];  Service: General;  Laterality: Left;    SOCIAL HISTORY: Social History   Socioeconomic History  . Marital status: Widowed    Spouse name: Not on file  . Number of children: Not on file  . Years of education: Not on file  . Highest education level: Not on file  Occupational History  . Not on file  Social Needs  . Financial resource strain: Not on file  . Food insecurity:    Worry: Not on file    Inability: Not on file  . Transportation needs:    Medical: Not on file    Non-medical: Not on file  Tobacco Use  . Smoking status: Never Smoker  . Smokeless tobacco: Never Used  Substance and Sexual Activity  .  Alcohol use: Never    Frequency: Never  . Drug use: Never  . Sexual activity: Not Currently    Birth control/protection: Post-menopausal  Lifestyle  . Physical activity:    Days per week: Not on file    Minutes per session: Not on file  . Stress: Not on file  Relationships  . Social connections:    Talks on phone: Not on file     Gets together: Not on file    Attends religious service: Not on file    Active member of club or organization: Not on file    Attends meetings of clubs or organizations: Not on file    Relationship status: Not on file  . Intimate partner violence:    Fear of current or ex partner: Not on file    Emotionally abused: Not on file    Physically abused: Not on file    Forced sexual activity: Not on file  Other Topics Concern  . Not on file  Social History Narrative  . Not on file    FAMILY HISTORY: No family history on file.  ALLERGIES:  is allergic to codeine.  MEDICATIONS:  Current Outpatient Medications  Medication Sig Dispense Refill  . atenolol (TENORMIN) 50 MG tablet Take 50 mg by mouth daily.    Marland Kitchen HYDROcodone-acetaminophen (NORCO/VICODIN) 5-325 MG tablet Take 1 tablet by mouth every 6 (six) hours as needed for moderate pain. 15 tablet 0  . HYDROcodone-acetaminophen (NORCO/VICODIN) 5-325 MG tablet Take 1 tablet by mouth every 6 (six) hours as needed for moderate pain. 15 tablet 0  . ibuprofen (ADVIL) 800 MG tablet Take 1 tablet (800 mg total) by mouth every 8 (eight) hours as needed. 30 tablet 0  . lisinopril-hydrochlorothiazide (PRINZIDE,ZESTORETIC) 20-12.5 MG tablet Take 1 tablet by mouth daily.    . Multiple Vitamin (MULTIVITAMIN WITH MINERALS) TABS tablet Take 1 tablet by mouth daily.    Marland Kitchen omeprazole (PRILOSEC) 20 MG capsule Take 20 mg by mouth daily.     No current facility-administered medications for this visit.     REVIEW OF SYSTEMS:   Constitutional: Denies fevers, chills or abnormal night sweats Eyes: Denies blurriness of vision, double vision or watery eyes Ears, nose, mouth, throat, and face: Denies mucositis or sore throat Respiratory: Denies cough, dyspnea or wheezes Cardiovascular: Denies palpitation, chest discomfort or lower extremity swelling Gastrointestinal:  Denies nausea, heartburn or change in bowel habits Skin: Denies abnormal skin rashes  Lymphatics: Denies new lymphadenopathy or easy bruising Neurological:Denies numbness, tingling or new weaknesses Behavioral/Psych: Mood is stable, no new changes  Breast: Denies any palpable lumps or discharge All other systems were reviewed with the patient and are negative.  PHYSICAL EXAMINATION: ECOG PERFORMANCE STATUS: 1 - Symptomatic but completely ambulatory  Vitals:   12/01/18 1326  BP: (!) 161/49  Pulse: 65  Resp: 17  Temp: 97.7 F (36.5 C)  SpO2: 99%   Filed Weights   12/01/18 1326  Weight: 185 lb 1.6 oz (84 kg)    Exam not done due to COVID-19 precautions  LABORATORY DATA:  I have reviewed the data as listed No results found for: WBC, HGB, HCT, MCV, PLT Lab Results  Component Value Date   NA 139 11/21/2018   K 4.7 11/21/2018   CL 105 11/21/2018   CO2 27 11/21/2018    RADIOGRAPHIC STUDIES: I have personally reviewed the radiological reports and agreed with the findings in the report.  ASSESSMENT AND PLAN:  Ductal carcinoma in situ (DCIS) of left  breast 09/20/2018:Left lumpectomy: Left central: Intermediate grade DCIS 2.5 cm, margins negative, left lateral lumpectomy: Intermediate grade DCIS 3.5 cm, involves posterior margin also focal superior, anterior and medial margins, ER 90%, PR 50% Left medial lumpectomy: Benign negative for DCIS 11/23/2018:Left mastectomy ([facility]): DCIS, intermediate grade, 1.6 cm, ER+ 95%, PR+ 50%, clear margins, 4 SLN negative for carcinoma.   Pathology counseling: I discussed the final pathology report of the patient provided  a copy of this report. I discussed the margins as well as lymph node surgeries. We also discussed the final staging along with previously performed ER/PR and HER-2/neu testing.  Treatment plan: adjuvant antiestrogen therapy with tamoxifen 20 mg daily x5 years Tamoxifen counseling: We discussed the risks and benefits of tamoxifen. These include but not limited to insomnia, hot flashes, mood changes, vaginal  dryness, and weight gain. Although rare, serious side effects including endometrial cancer, risk of blood clots were also discussed. We strongly believe that the benefits far outweigh the risks.   After listening to the risks and benefits of tamoxifen, patient decided that she does not want to take it.  I also discussed with the patient that she needs genetic counseling because her mother and sister had breast cancers.  Patient wants to think about it and will let us know at a later time. She wants to follow-up with her primary care physician and Dr. Brantley Stage.  She will be seen on an as-needed basis.  All questions were answered. The patient knows to call the clinic with any problems, questions or concerns.   Nicholas Lose, MD 12/01/2018    I, Cloyde Reams Dorshimer, am acting as scribe for Nicholas Lose, MD.  I have reviewed the above documentation for accuracy and completeness, and I agree with the above.

## 2018-12-01 ENCOUNTER — Other Ambulatory Visit: Payer: Self-pay

## 2018-12-01 ENCOUNTER — Inpatient Hospital Stay: Payer: Medicare HMO | Attending: Hematology and Oncology | Admitting: Hematology and Oncology

## 2018-12-01 DIAGNOSIS — Z79899 Other long term (current) drug therapy: Secondary | ICD-10-CM | POA: Insufficient documentation

## 2018-12-01 DIAGNOSIS — Z9012 Acquired absence of left breast and nipple: Secondary | ICD-10-CM | POA: Diagnosis not present

## 2018-12-01 DIAGNOSIS — D0512 Intraductal carcinoma in situ of left breast: Secondary | ICD-10-CM | POA: Diagnosis not present

## 2018-12-01 DIAGNOSIS — Z170 Estrogen receptor positive status [ER+]: Secondary | ICD-10-CM | POA: Diagnosis not present

## 2018-12-01 DIAGNOSIS — D0511 Intraductal carcinoma in situ of right breast: Secondary | ICD-10-CM | POA: Diagnosis not present

## 2018-12-01 DIAGNOSIS — I10 Essential (primary) hypertension: Secondary | ICD-10-CM | POA: Diagnosis not present

## 2018-12-01 DIAGNOSIS — Z791 Long term (current) use of non-steroidal anti-inflammatories (NSAID): Secondary | ICD-10-CM | POA: Diagnosis not present

## 2018-12-01 NOTE — Assessment & Plan Note (Addendum)
09/20/2018:Left lumpectomy: Left central: Intermediate grade DCIS 2.5 cm, margins negative, left lateral lumpectomy: Intermediate grade DCIS 3.5 cm, involves posterior margin also focal superior, anterior and medial margins, ER 90%, PR 50% Left medial lumpectomy: Benign negative for DCIS 11/23/2018:Left mastectomy ([facility]): DCIS, intermediate grade, 1.6 cm, ER+ 95%, PR+ 50%, clear margins, 4 SLN negative for carcinoma.   Pathology counseling: I discussed the final pathology report of the patient provided  a copy of this report. I discussed the margins as well as lymph node surgeries. We also discussed the final staging along with previously performed ER/PR and HER-2/neu testing.  Treatment plan: 1.  Adjuvant radiation therapy 2.  Followed by adjuvant antiestrogen therapy with tamoxifen 20 mg daily x5 years  Tamoxifen counseling: We discussed the risks and benefits of tamoxifen. These include but not limited to insomnia, hot flashes, mood changes, vaginal dryness, and weight gain. Although rare, serious side effects including endometrial cancer, risk of blood clots were also discussed. We strongly believe that the benefits far outweigh the risks. Patient understands these risks and consented to starting treatment. Planned treatment duration is 5 years.  Return to clinic in 3 months for survivorship care plan is

## 2018-12-02 ENCOUNTER — Encounter: Payer: Self-pay | Admitting: *Deleted

## 2018-12-05 ENCOUNTER — Telehealth: Payer: Self-pay | Admitting: Hematology and Oncology

## 2018-12-05 NOTE — Telephone Encounter (Signed)
Tried to reach regarding schedule °

## 2018-12-21 ENCOUNTER — Telehealth: Payer: Self-pay | Admitting: *Deleted

## 2018-12-21 NOTE — Telephone Encounter (Signed)
Patient called back and said that Dr. Lindi Adie and Dr. Brantley Stage told her that everything looked good. She wishes not to proceed with consultation or radiation treatment.

## 2018-12-21 NOTE — Telephone Encounter (Signed)
Called patient and lvm for callback to scheule consultation.

## 2019-02-10 DIAGNOSIS — C50912 Malignant neoplasm of unspecified site of left female breast: Secondary | ICD-10-CM | POA: Diagnosis not present

## 2019-02-15 ENCOUNTER — Telehealth: Payer: Self-pay | Admitting: Adult Health

## 2019-02-15 NOTE — Telephone Encounter (Signed)
I left a message regarding video visit and to call if need my chart support

## 2019-03-07 ENCOUNTER — Inpatient Hospital Stay: Payer: Medicare HMO | Attending: Adult Health | Admitting: Adult Health

## 2019-03-07 DIAGNOSIS — D0512 Intraductal carcinoma in situ of left breast: Secondary | ICD-10-CM

## 2019-03-07 NOTE — Progress Notes (Signed)
Patient did not connect to my chart, and did not answer call for her SCP appointment.  I left a message on her machine to offer time to reschedule.    Wilber Bihari, NP

## 2019-04-21 DIAGNOSIS — Z299 Encounter for prophylactic measures, unspecified: Secondary | ICD-10-CM | POA: Diagnosis not present

## 2019-04-21 DIAGNOSIS — Z23 Encounter for immunization: Secondary | ICD-10-CM | POA: Diagnosis not present

## 2019-06-09 DIAGNOSIS — D0512 Intraductal carcinoma in situ of left breast: Secondary | ICD-10-CM | POA: Diagnosis not present

## 2019-06-13 ENCOUNTER — Encounter: Payer: Self-pay | Admitting: *Deleted

## 2019-06-13 DIAGNOSIS — Z7189 Other specified counseling: Secondary | ICD-10-CM | POA: Diagnosis not present

## 2019-06-13 DIAGNOSIS — Z1211 Encounter for screening for malignant neoplasm of colon: Secondary | ICD-10-CM | POA: Diagnosis not present

## 2019-06-13 DIAGNOSIS — R5383 Other fatigue: Secondary | ICD-10-CM | POA: Diagnosis not present

## 2019-06-13 DIAGNOSIS — I10 Essential (primary) hypertension: Secondary | ICD-10-CM | POA: Diagnosis not present

## 2019-06-13 DIAGNOSIS — Z6828 Body mass index (BMI) 28.0-28.9, adult: Secondary | ICD-10-CM | POA: Diagnosis not present

## 2019-06-13 DIAGNOSIS — E7800 Pure hypercholesterolemia, unspecified: Secondary | ICD-10-CM | POA: Diagnosis not present

## 2019-06-13 DIAGNOSIS — Z1331 Encounter for screening for depression: Secondary | ICD-10-CM | POA: Diagnosis not present

## 2019-06-13 DIAGNOSIS — Z299 Encounter for prophylactic measures, unspecified: Secondary | ICD-10-CM | POA: Diagnosis not present

## 2019-06-13 DIAGNOSIS — Z0000 Encounter for general adult medical examination without abnormal findings: Secondary | ICD-10-CM | POA: Diagnosis not present

## 2019-06-13 DIAGNOSIS — Z79899 Other long term (current) drug therapy: Secondary | ICD-10-CM | POA: Diagnosis not present

## 2019-06-13 DIAGNOSIS — Z1339 Encounter for screening examination for other mental health and behavioral disorders: Secondary | ICD-10-CM | POA: Diagnosis not present

## 2019-08-23 DIAGNOSIS — R7881 Bacteremia: Secondary | ICD-10-CM | POA: Diagnosis not present

## 2019-08-23 DIAGNOSIS — B9562 Methicillin resistant Staphylococcus aureus infection as the cause of diseases classified elsewhere: Secondary | ICD-10-CM | POA: Diagnosis not present

## 2019-08-23 DIAGNOSIS — R69 Illness, unspecified: Secondary | ICD-10-CM | POA: Diagnosis not present

## 2019-09-18 DIAGNOSIS — R69 Illness, unspecified: Secondary | ICD-10-CM | POA: Diagnosis not present

## 2020-01-16 DIAGNOSIS — Z1231 Encounter for screening mammogram for malignant neoplasm of breast: Secondary | ICD-10-CM | POA: Diagnosis not present

## 2020-03-17 IMAGING — MG MM PLC BREAST LOC DEV 1ST LESION INC*R*
6 series · 6 of 6 positions shown · non-contrast
Comparison: Previous exam(s).

CLINICAL DATA: Biopsy proven ductal papillomas in the right breast.
Surgical excision recommended.

EXAM:
MAMMOGRAPHIC GUIDED RADIOACTIVE SEED LOCALIZATION OF THE RIGHT
BREAST

[R LM]
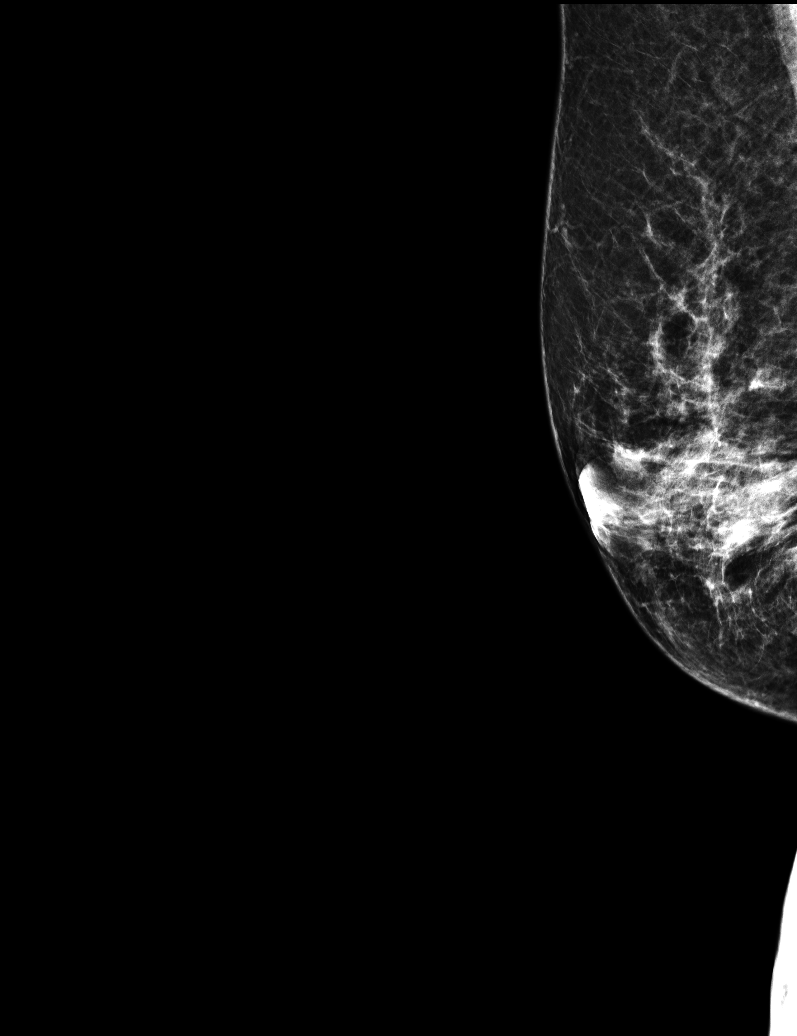

[R CC (1 of 5)]
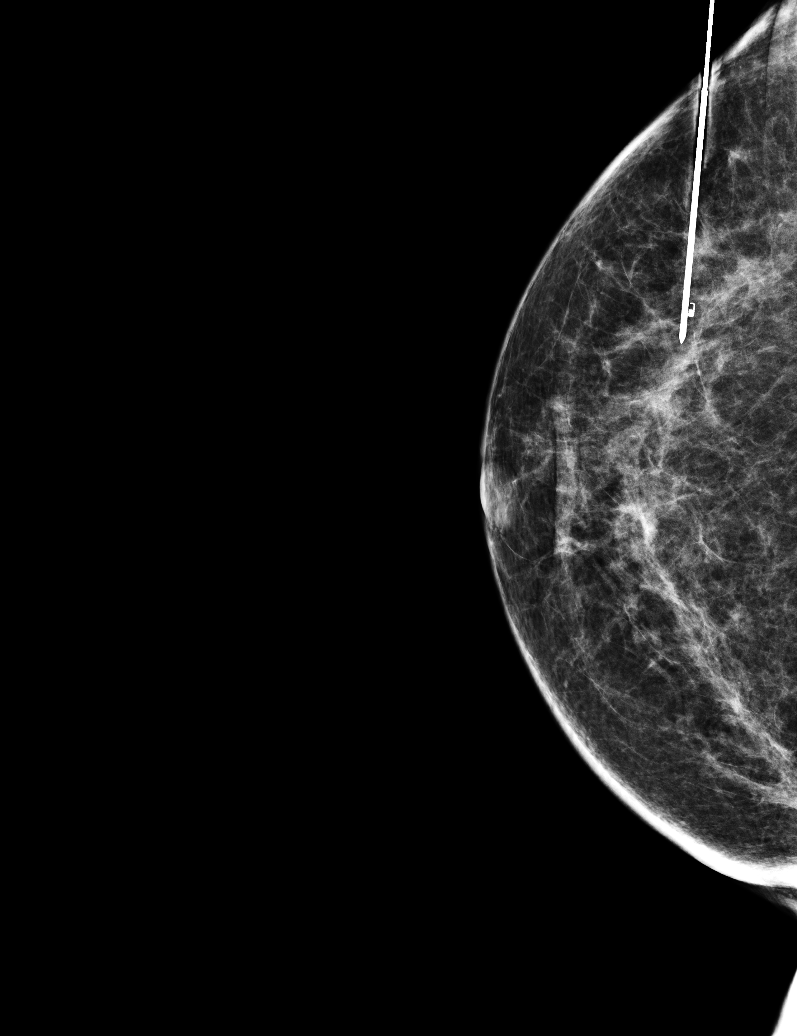

[R CC (2 of 5)]
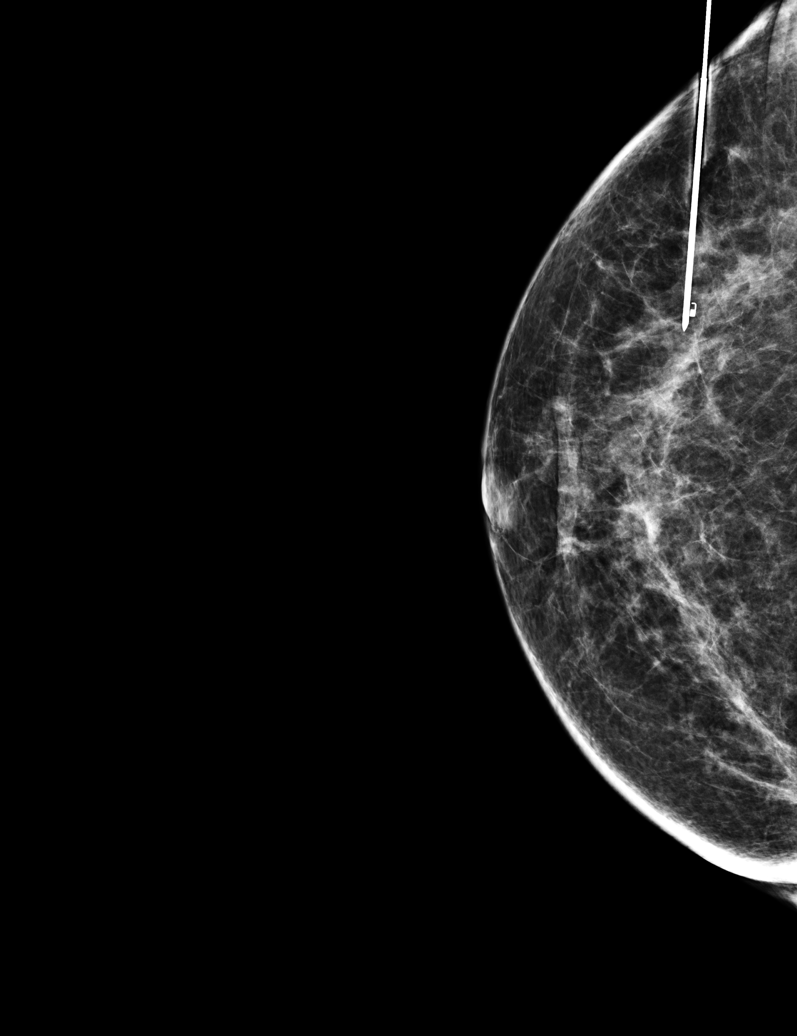

[R CC (3 of 5)]
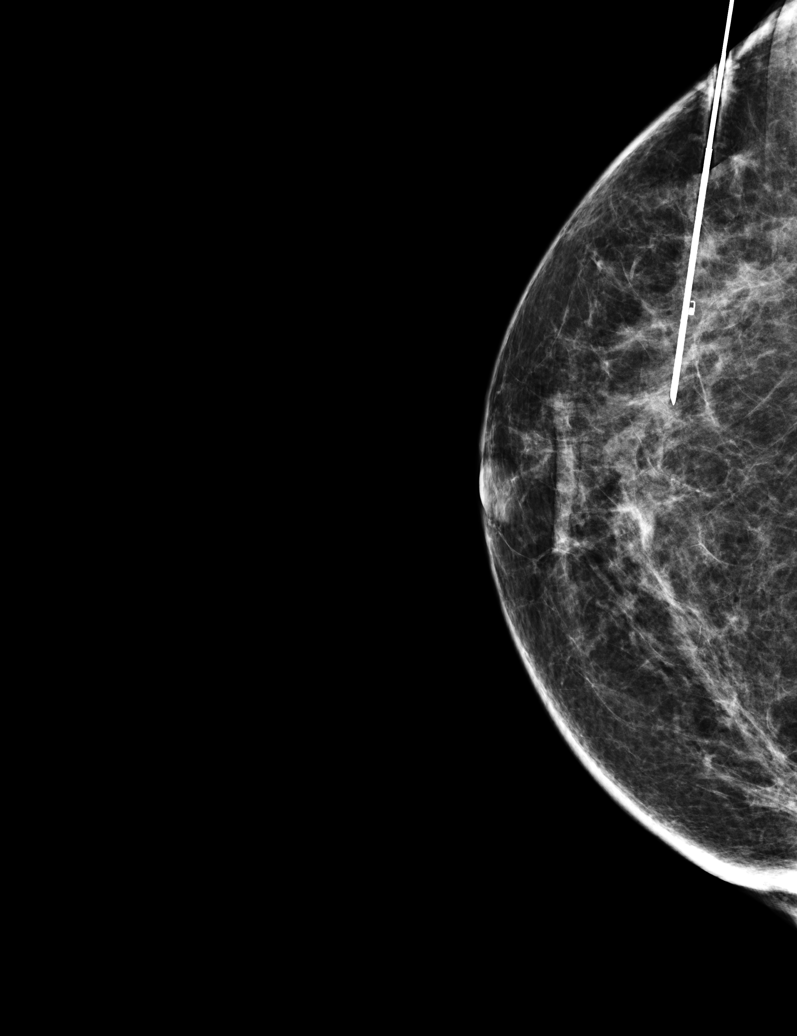

[R CC (4 of 5)]
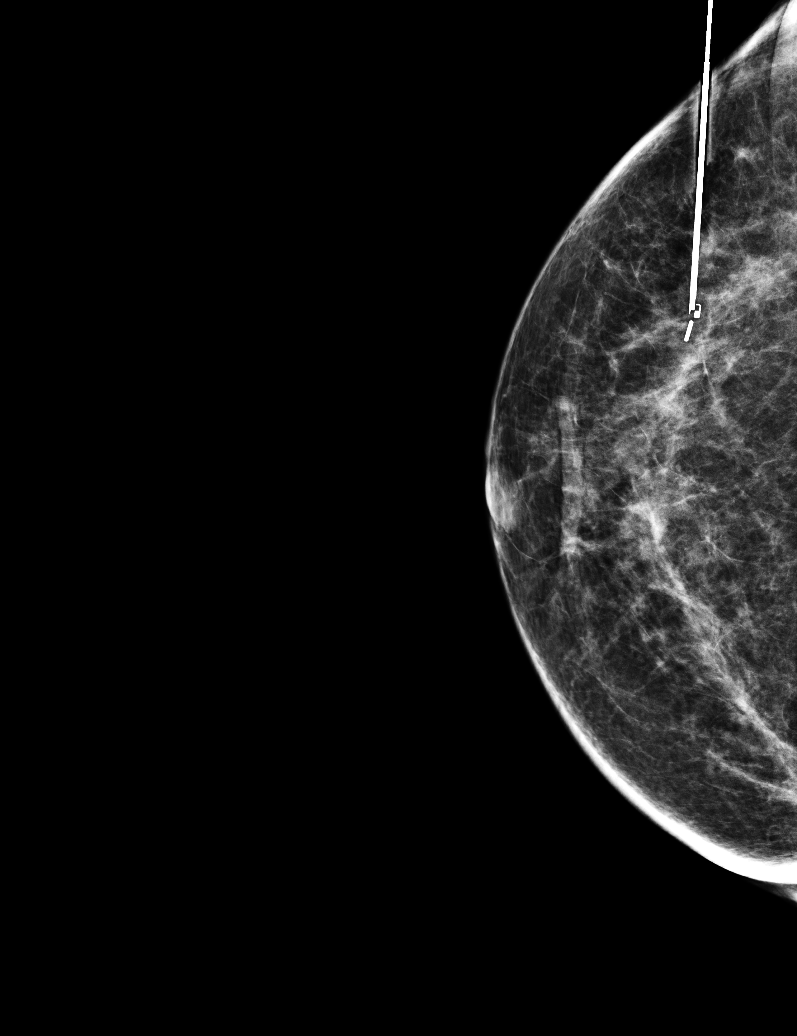

[R CC (5 of 5)]
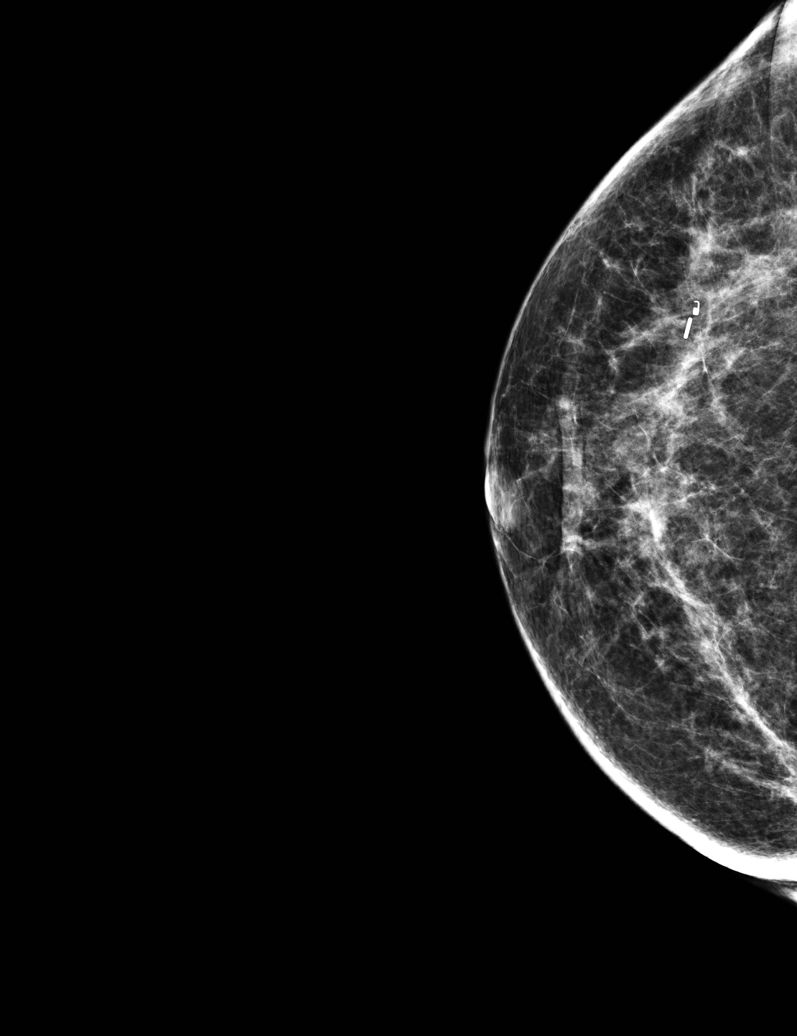

[6 of 6 positions shown; findings below may reference images not displayed]



The usual time-out protocol was performed immediately prior to the
procedure.

Using mammographic guidance, sterile technique, 1% lidocaine and an
V-8H0 radioactive seed, coil shaped was localized using a lateral to
medial approach. The follow-up mammogram images confirm the seed in
the expected location and were marked for Dr. Labelle Salha.

Follow-up survey of the patient confirms presence of the radioactive
seed.

Order number of V-8H0 seed:  [id number].

Total activity:  0.247 millicuries reference Date: 08/22/2018

The patient tolerated the procedure well and was released from the
[REDACTED]. She was given instructions regarding seed removal.
IMPRESSION: Radioactive seed localization right breast. No apparent
complications.

## 2020-03-18 IMAGING — DX BREAST SURGICAL SPECIMEN
1 series · 2 of 2 positions shown · non-contrast
Comparison: Previous exam(s).
COMPARISON: Previous exam(s).
COMPARISON: Previous exam(s).

Addendum:
CLINICAL DATA: Post left breast lumpectomy.

EXAM:
SPECIMEN RADIOGRAPH OF THE LEFT BREAST
CLINICAL DATA: Post left breast excision.

[Series 2: specimen digital x-ray, derived · left · 2 of 2 slices shown]
[im 1/2]
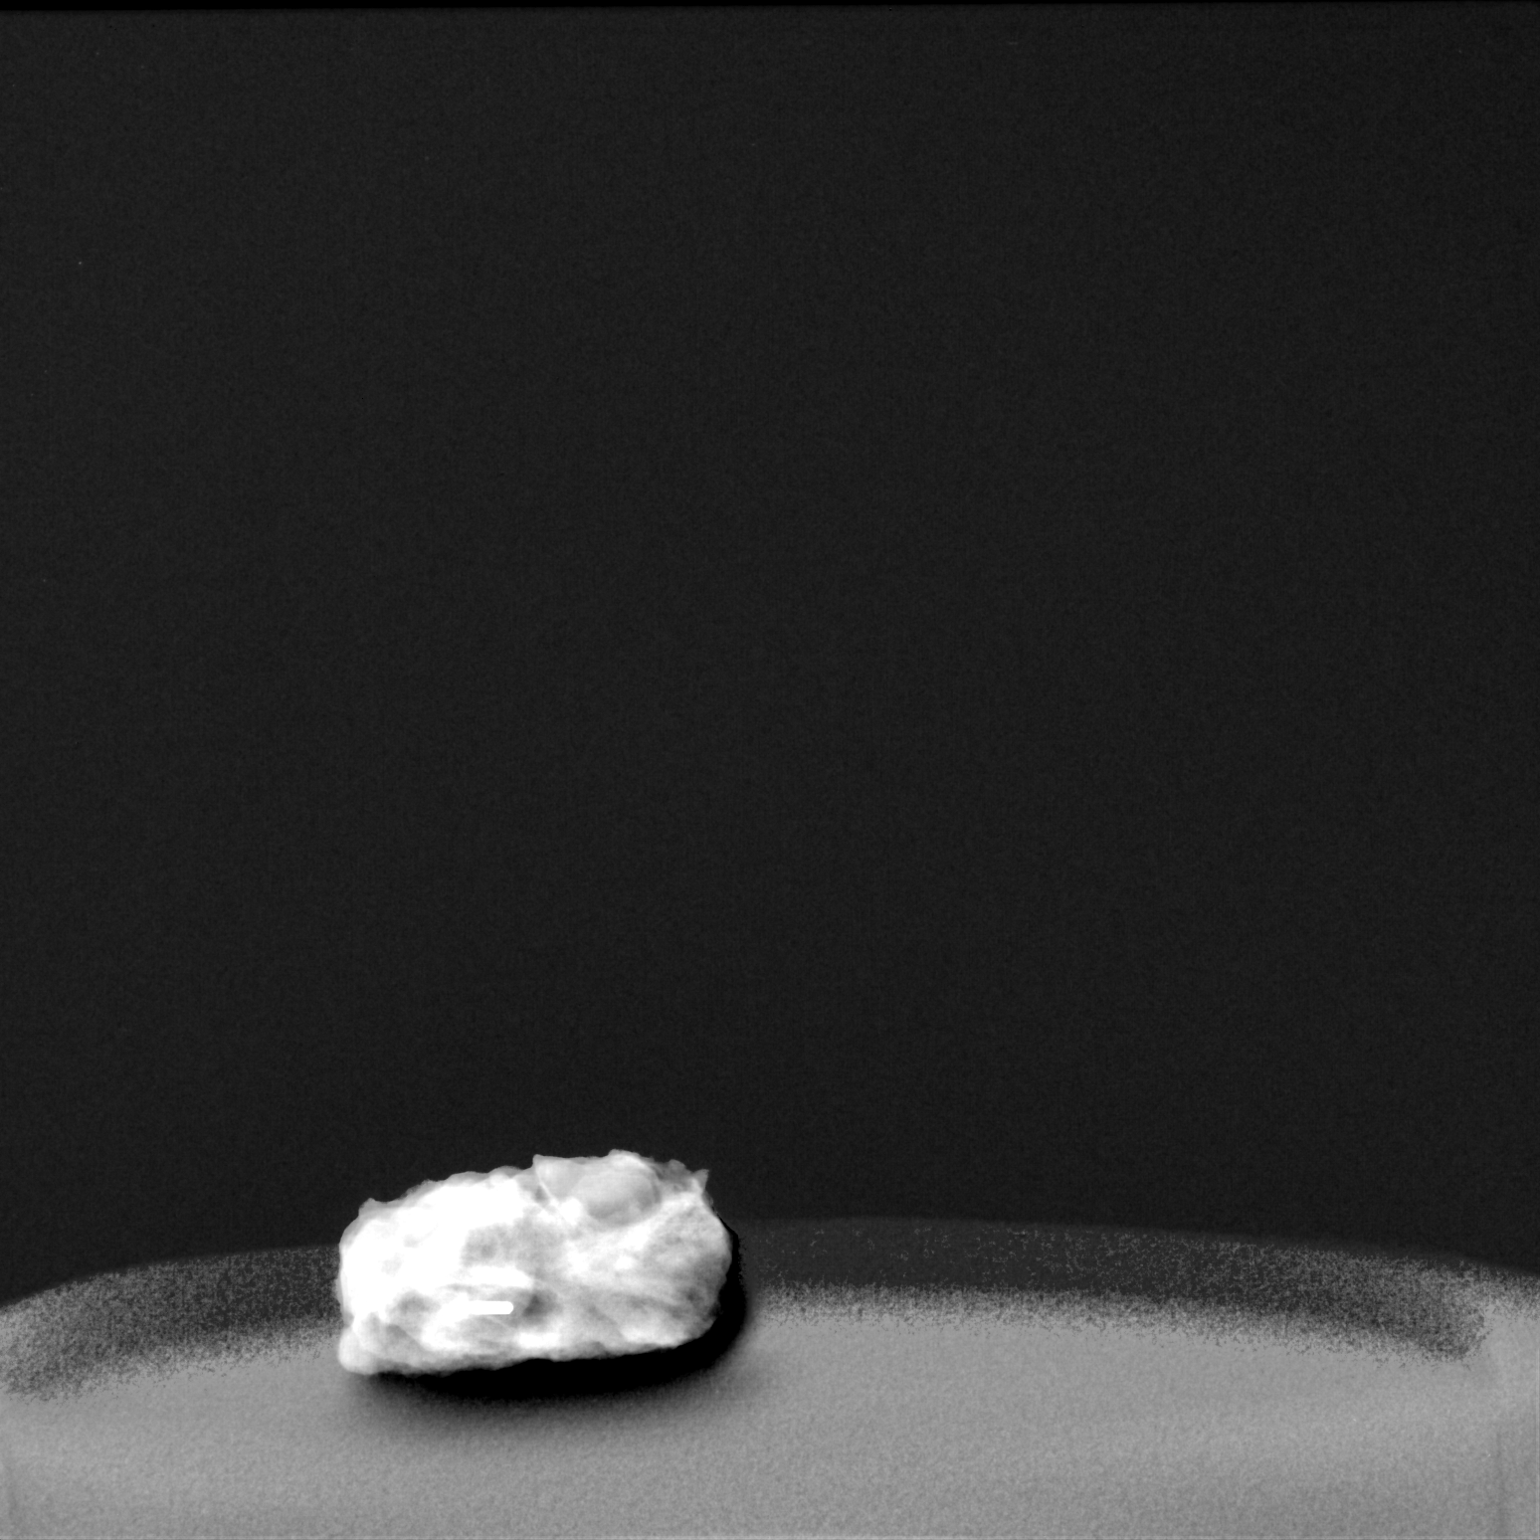
[im 2/2]
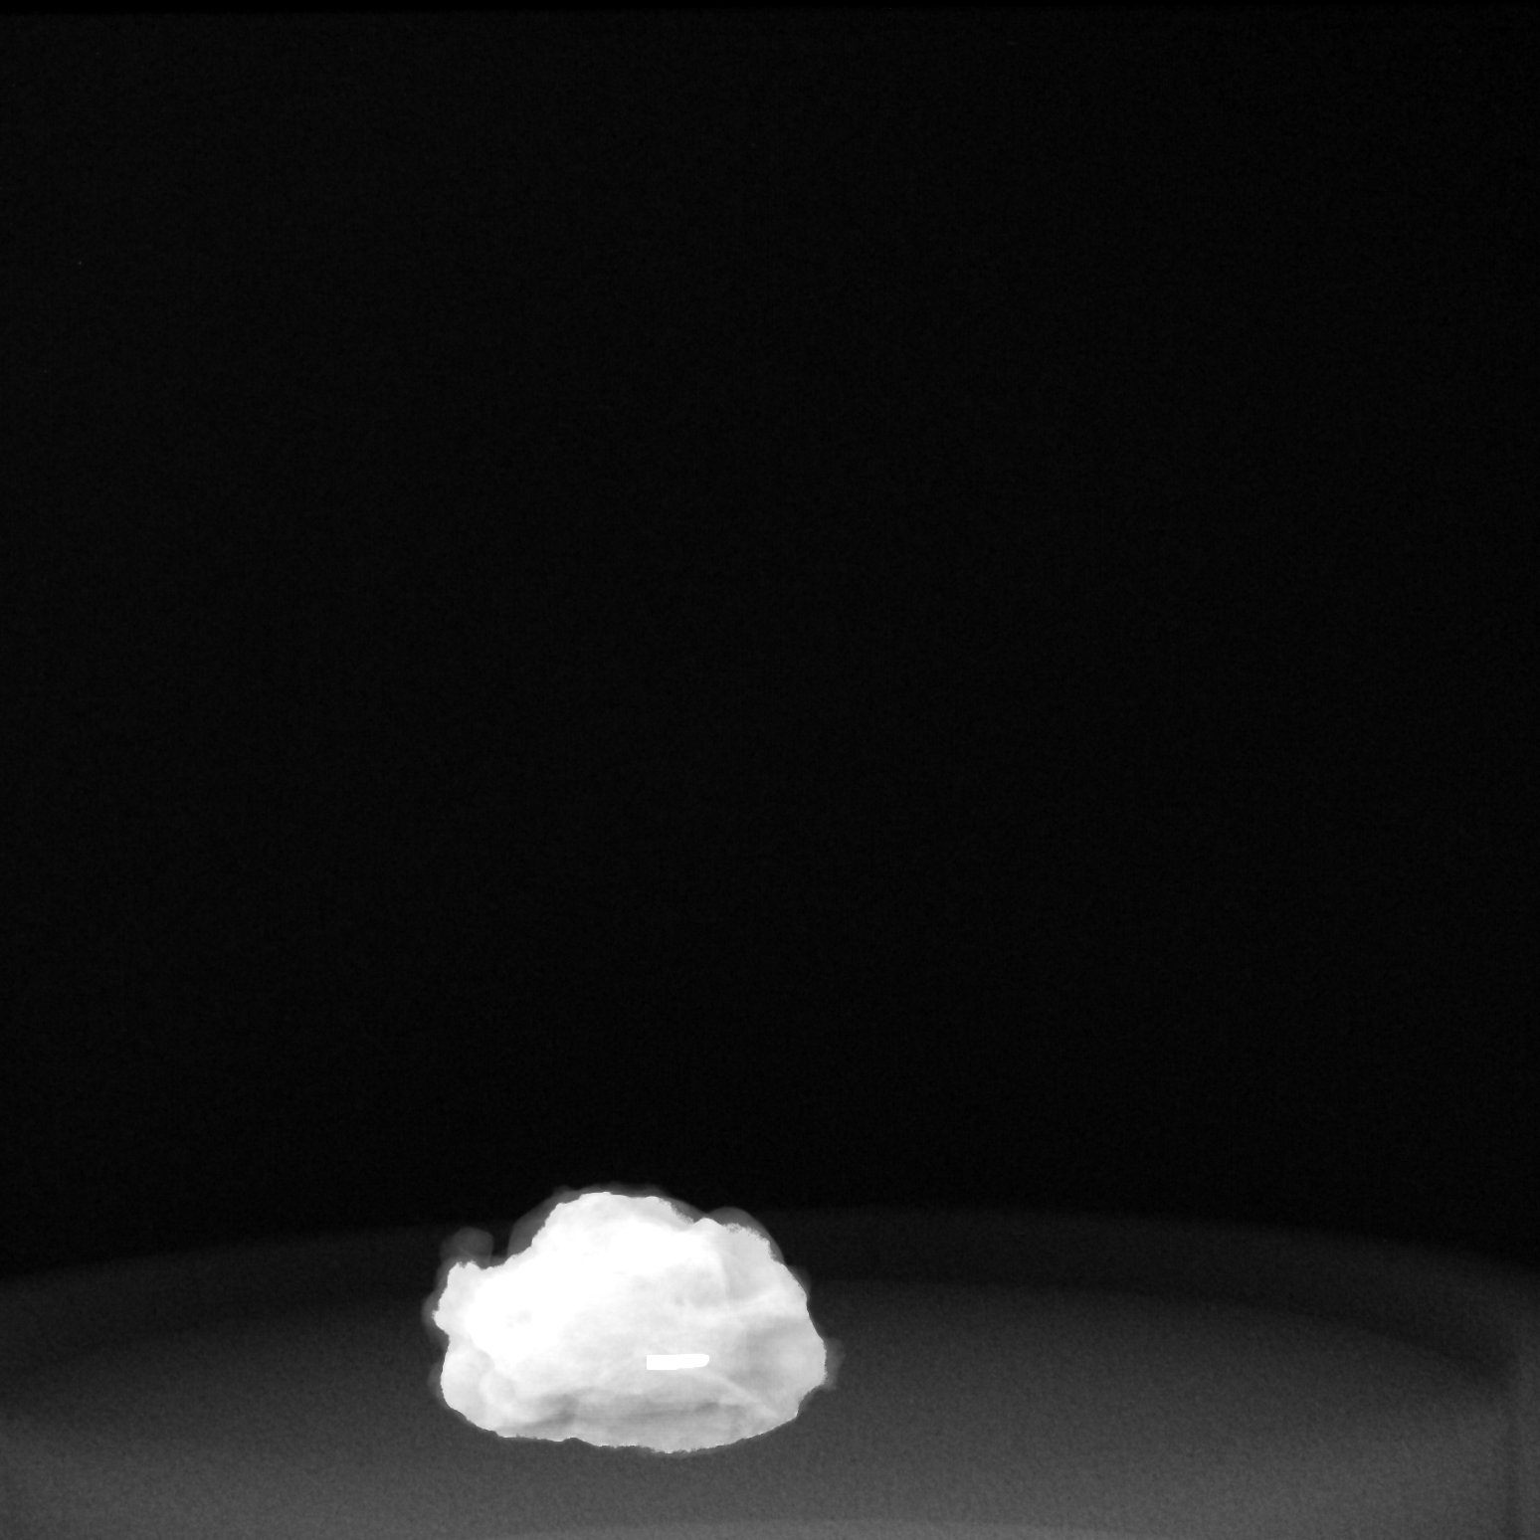

[2 of 2 positions shown; findings below may reference images not displayed]

FINDINGS: Status post excision of the left breast. The radioactive seed and
biopsy marker clip are present, completely intact, and were marked
for pathology.
IMPRESSION: Specimen radiograph of the left breast.

ADDENDUM:
An addendum is made to this report, which should read as:
FINDINGS: Status post excision of the left breast. The radioactive seed only
is present, completely intact, and marked for pathology. The X
shaped biopsy marking clip is not included in the specimen. The OR
staff is aware of this finding.
IMPRESSION: Specimen radiograph of the left breast.

*** End of Addendum ***
FINDINGS: Status post excision of the left breast. The radioactive seed and
biopsy marker clip are present, completely intact, and were marked
for pathology.
IMPRESSION: Specimen radiograph of the left breast.

## 2020-03-18 IMAGING — DX BREAST SURGICAL SPECIMEN
1 series · 2 of 2 positions shown · non-contrast
Comparison: Previous exam(s).

CLINICAL DATA: Post left breast excision.

EXAM:
SPECIMEN RADIOGRAPH OF THE LEFT BREAST

[Series 2: specimen digital x-ray, derived · left · 2 of 2 slices shown]
[im 1/2]
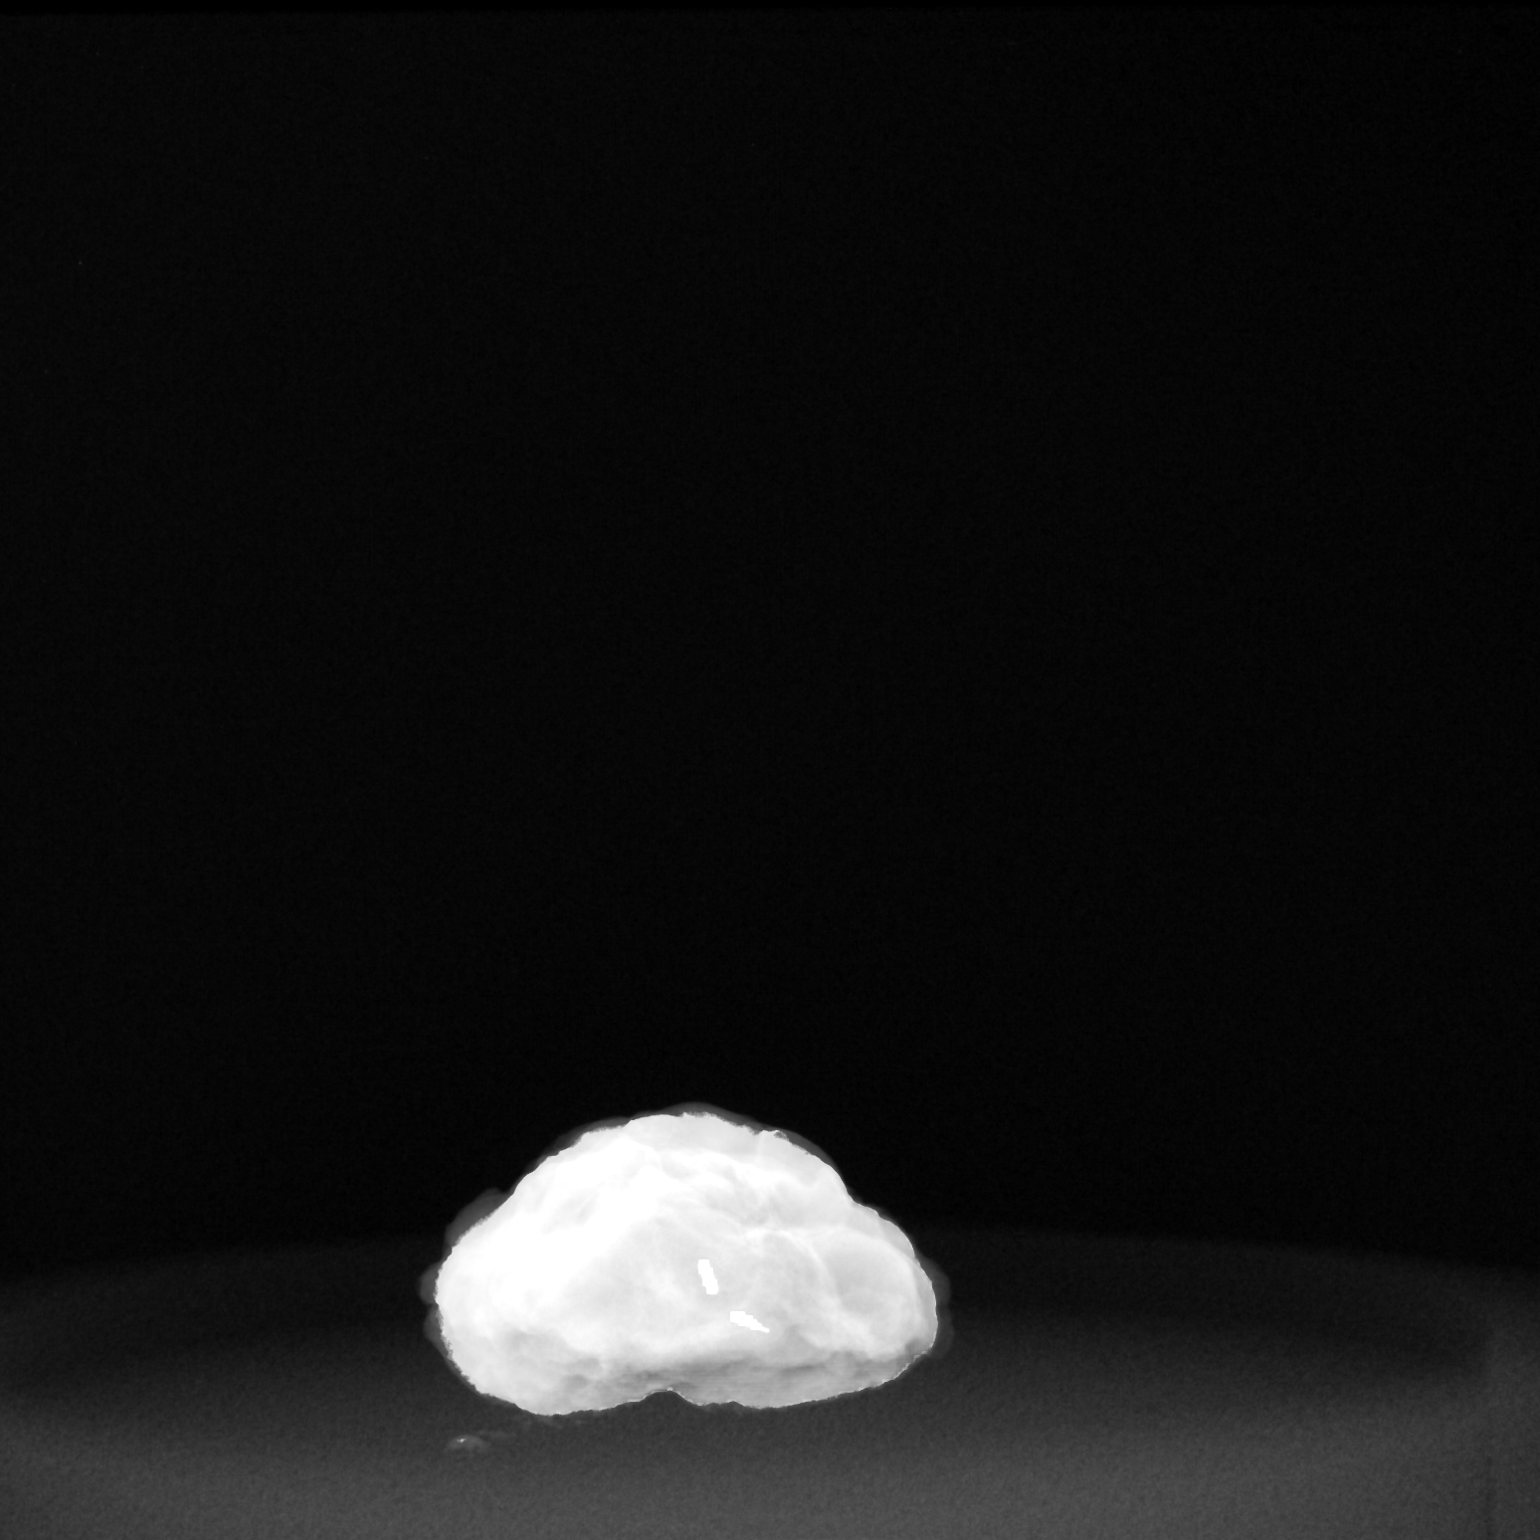
[im 2/2]
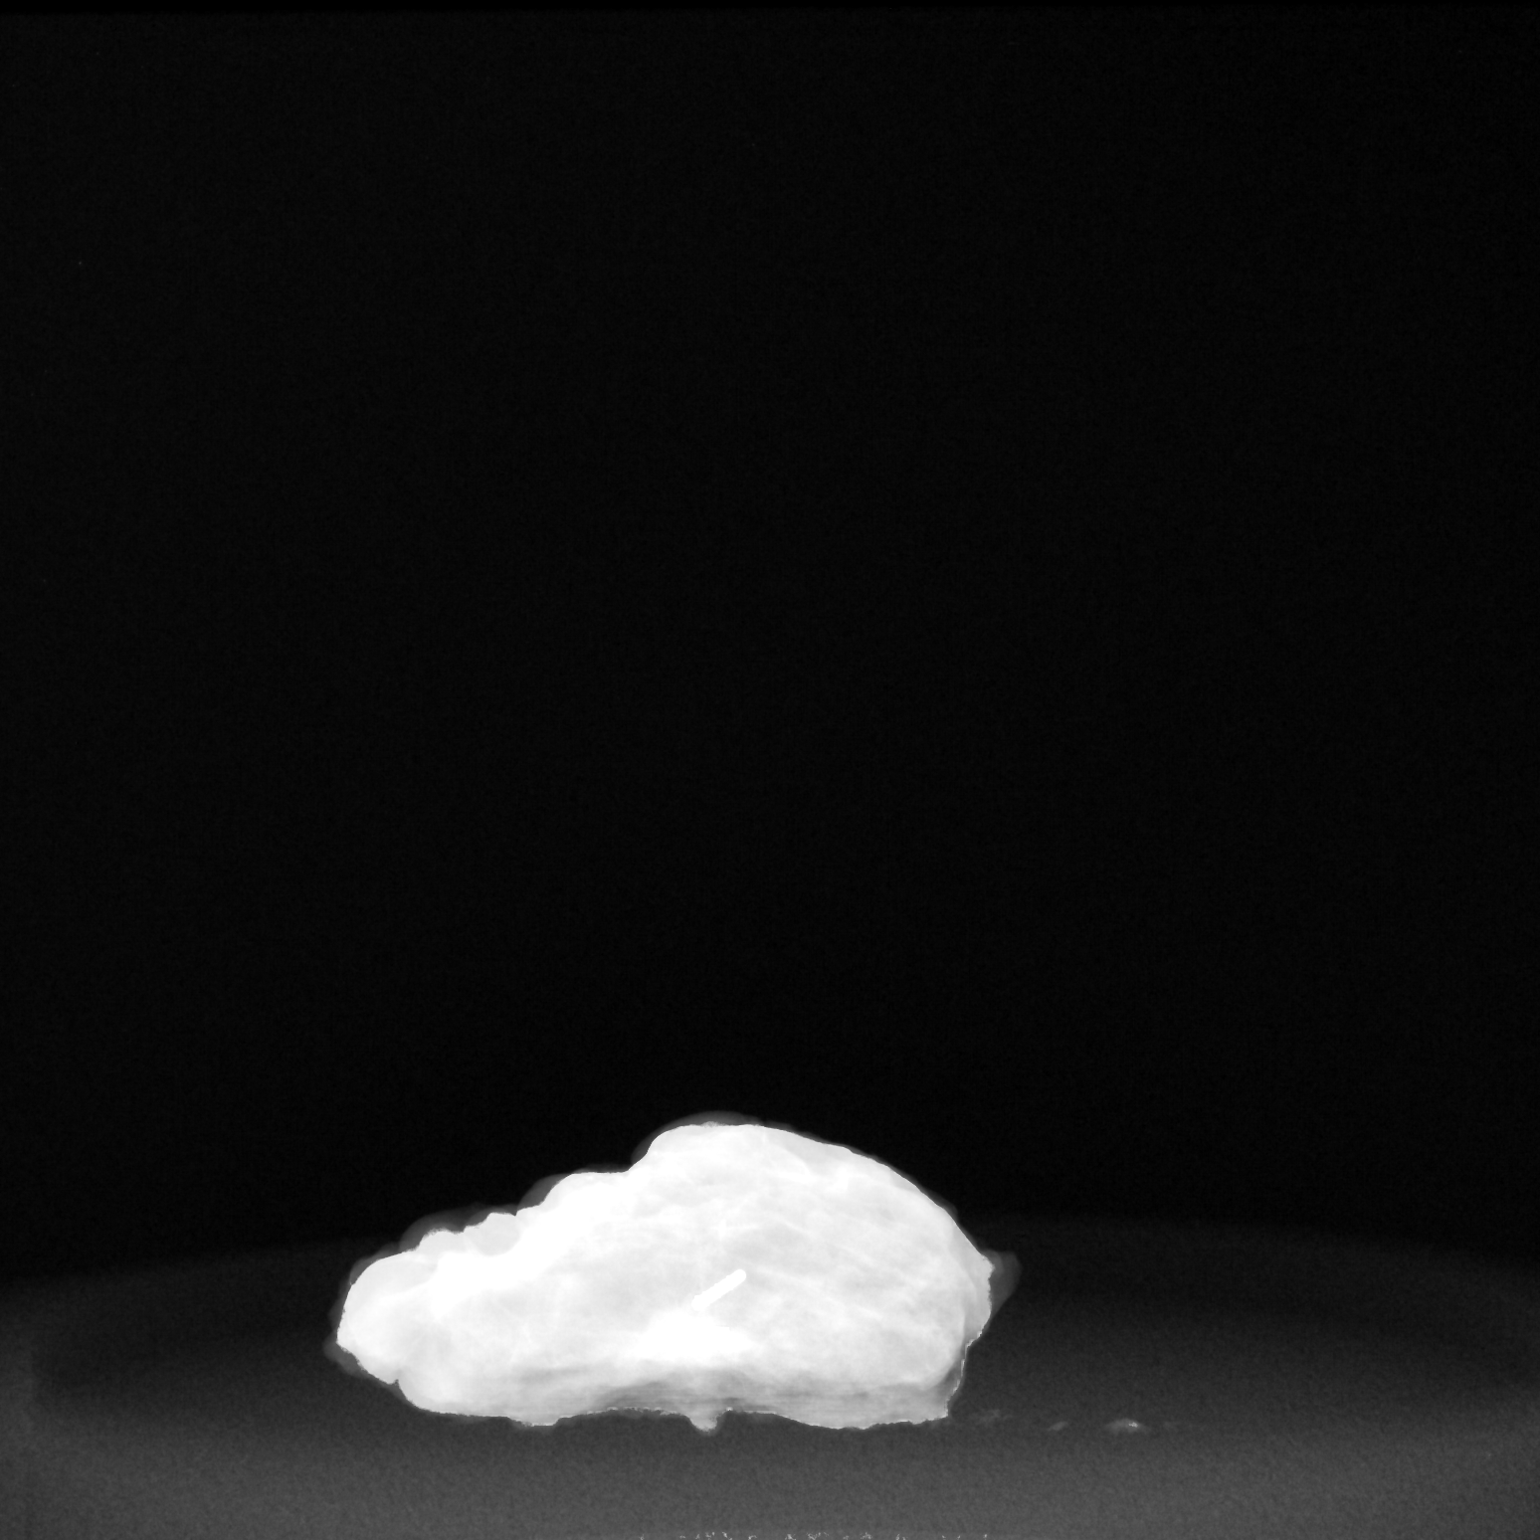

[2 of 2 positions shown; findings below may reference images not displayed]

FINDINGS: Status post excision of the left breast. The radioactive seed and
ribbon shaped biopsy marker clip are both present, completely
intact, and were marked for pathology.
IMPRESSION: Specimen radiograph of the left breast.

## 2020-03-18 IMAGING — DX BREAST SURGICAL SPECIMEN
1 series · 2 of 2 positions shown · non-contrast
Comparison: Previous exam(s).

CLINICAL DATA: Post right breast excision.

EXAM:
SPECIMEN RADIOGRAPH OF THE RIGHT BREAST

[Series 2: specimen digital x-ray, derived · right · 2 of 2 slices shown]
[im 1/2]
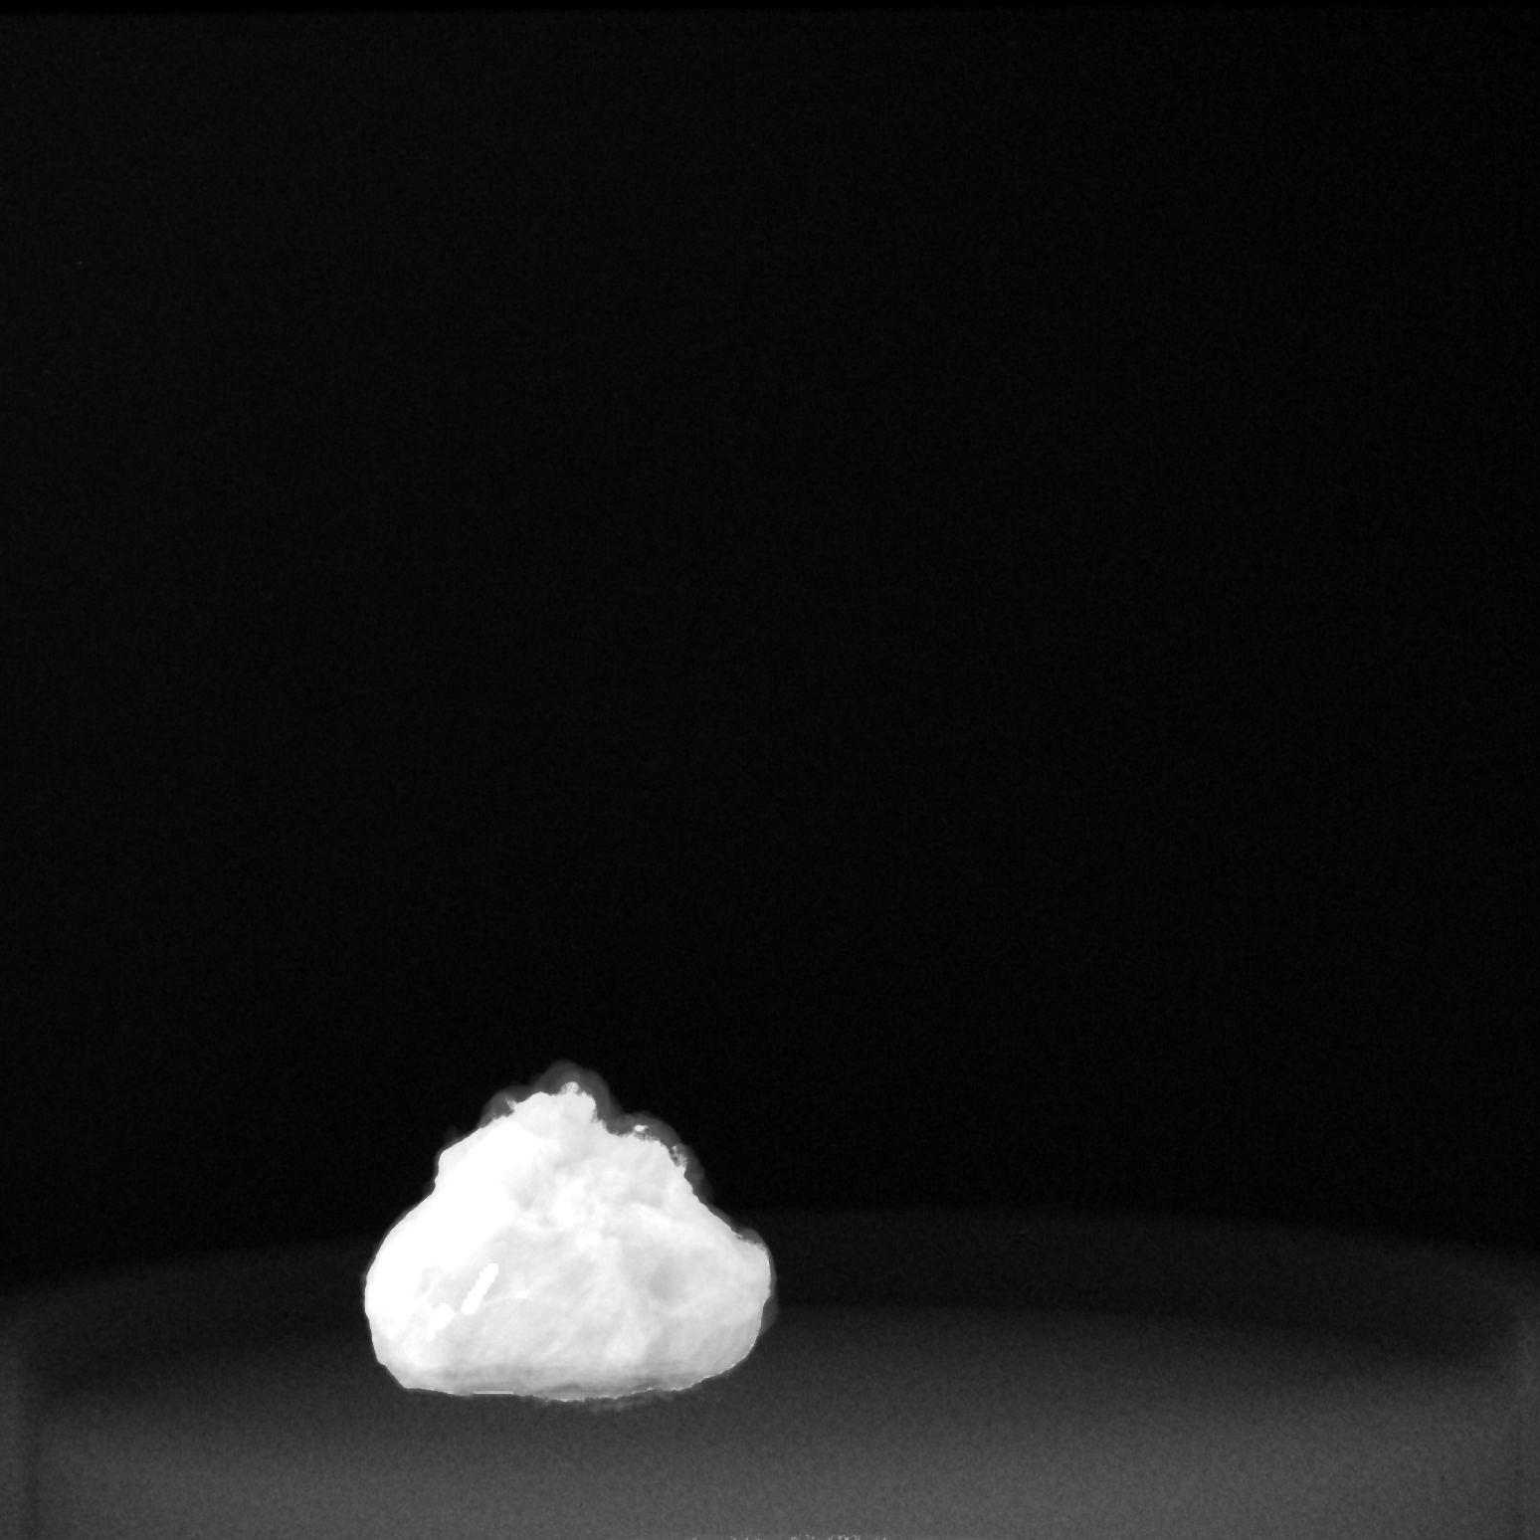
[im 2/2]
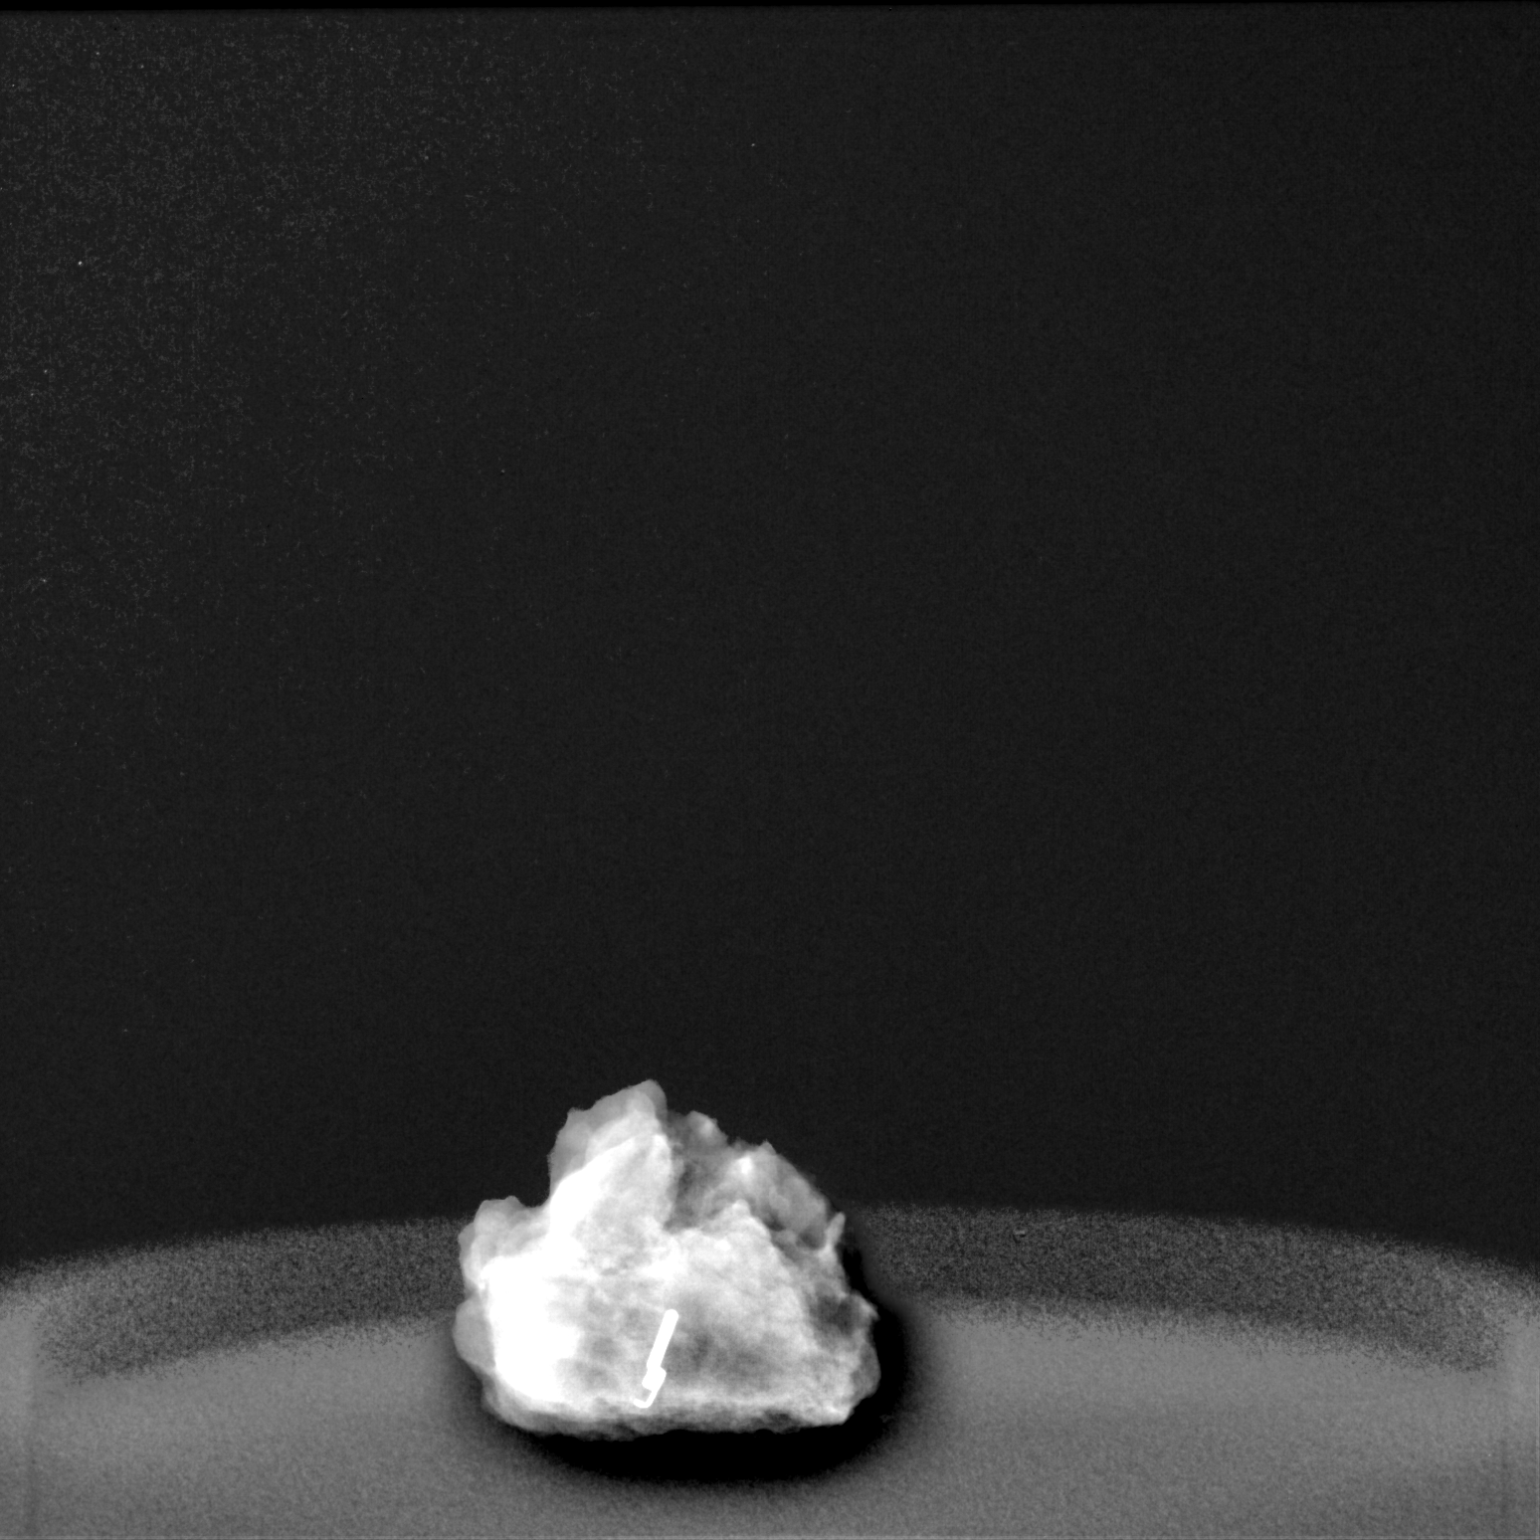

[2 of 2 positions shown; findings below may reference images not displayed]

FINDINGS: Status post excision of the right breast. The radioactive seed and
coil shaped biopsy marker clip are present, completely intact, and
were marked for pathology.
IMPRESSION: Specimen radiograph of the right breast.

## 2020-03-18 IMAGING — DX BREAST SURGICAL SPECIMEN
1 series · 2 of 2 positions shown · non-contrast
Comparison: Previous exam(s).

CLINICAL DATA: Post left breast excision.

EXAM:
SPECIMEN RADIOGRAPH OF THE LEFT BREAST

[Series 2: specimen digital x-ray, derived · left · 2 of 2 slices shown]
[im 1/2]
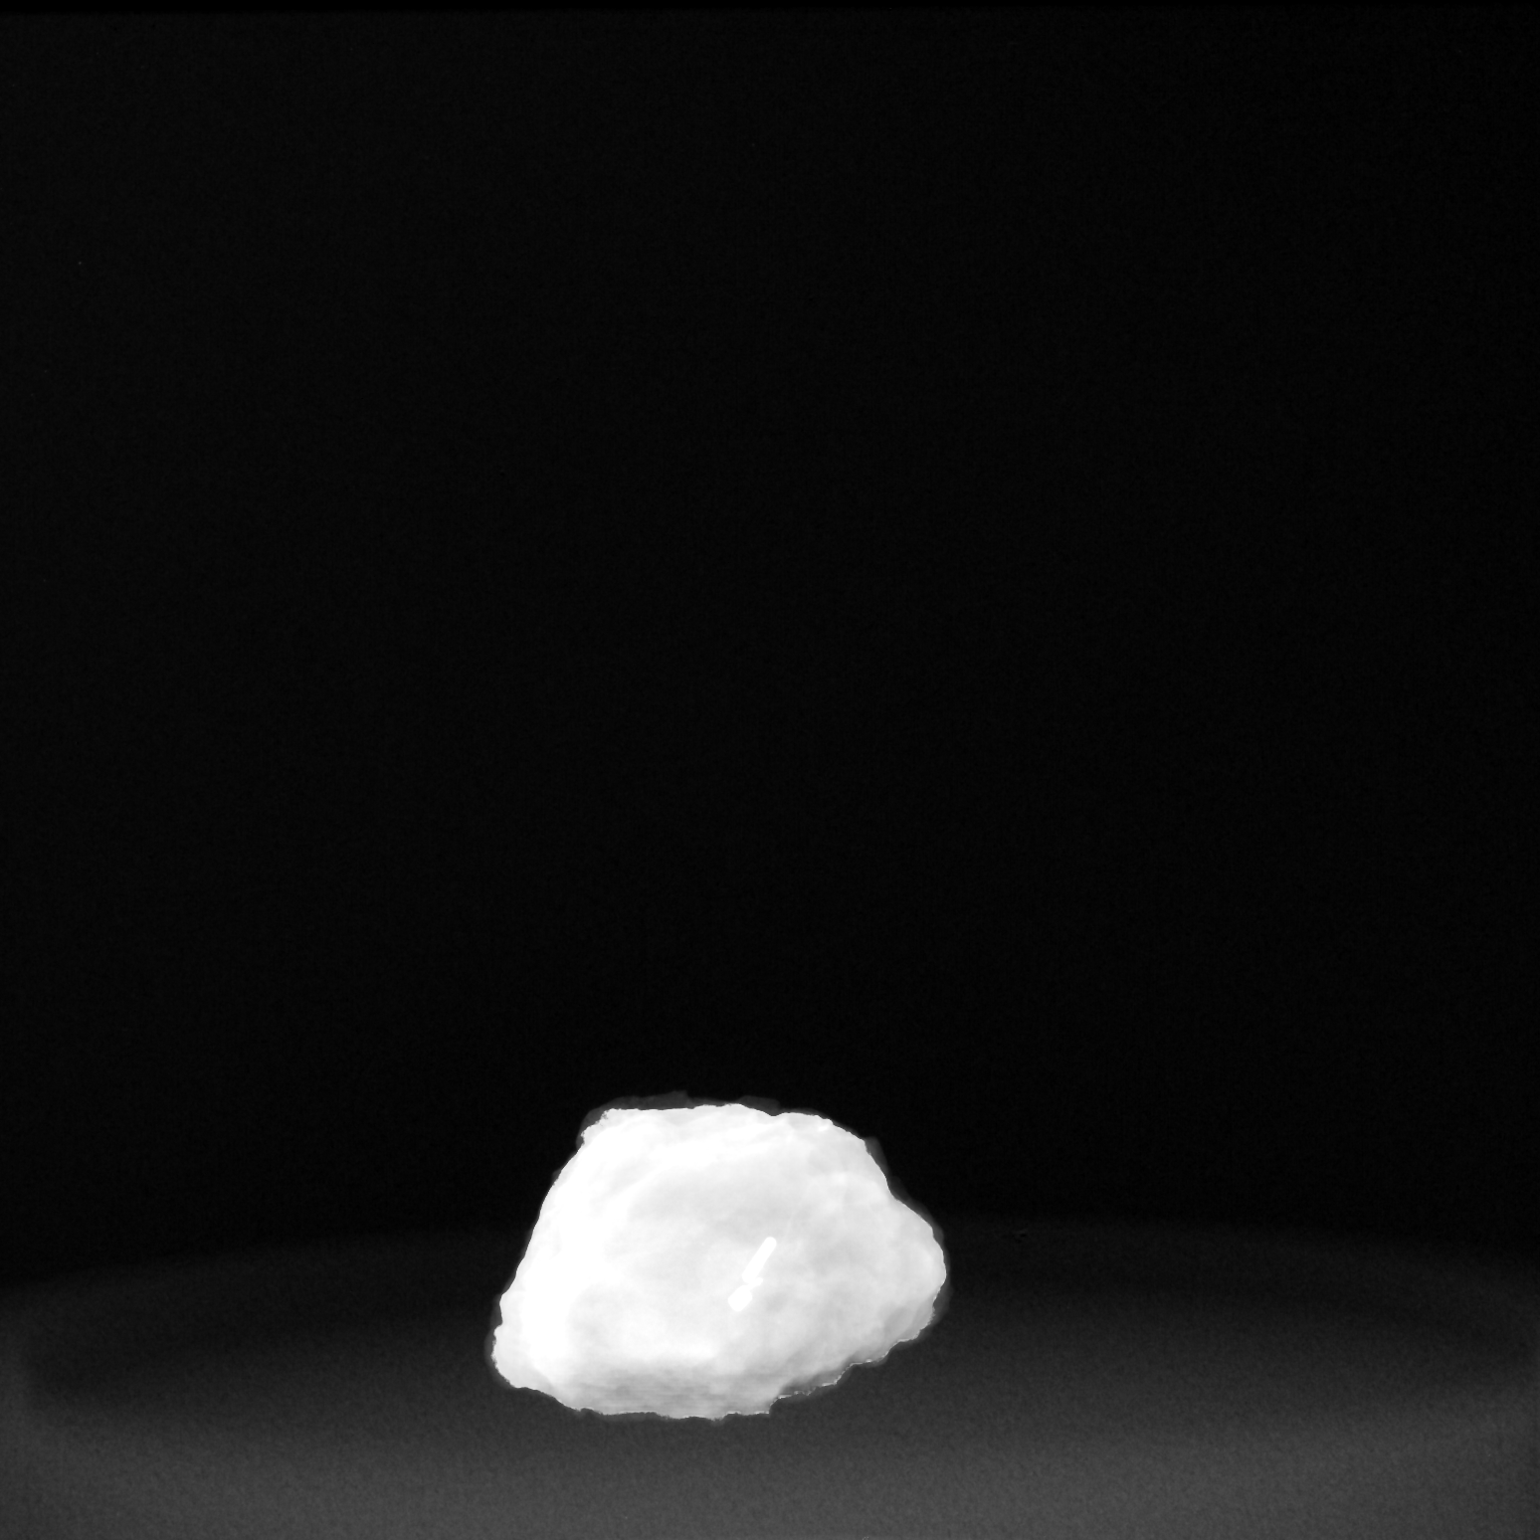
[im 2/2]
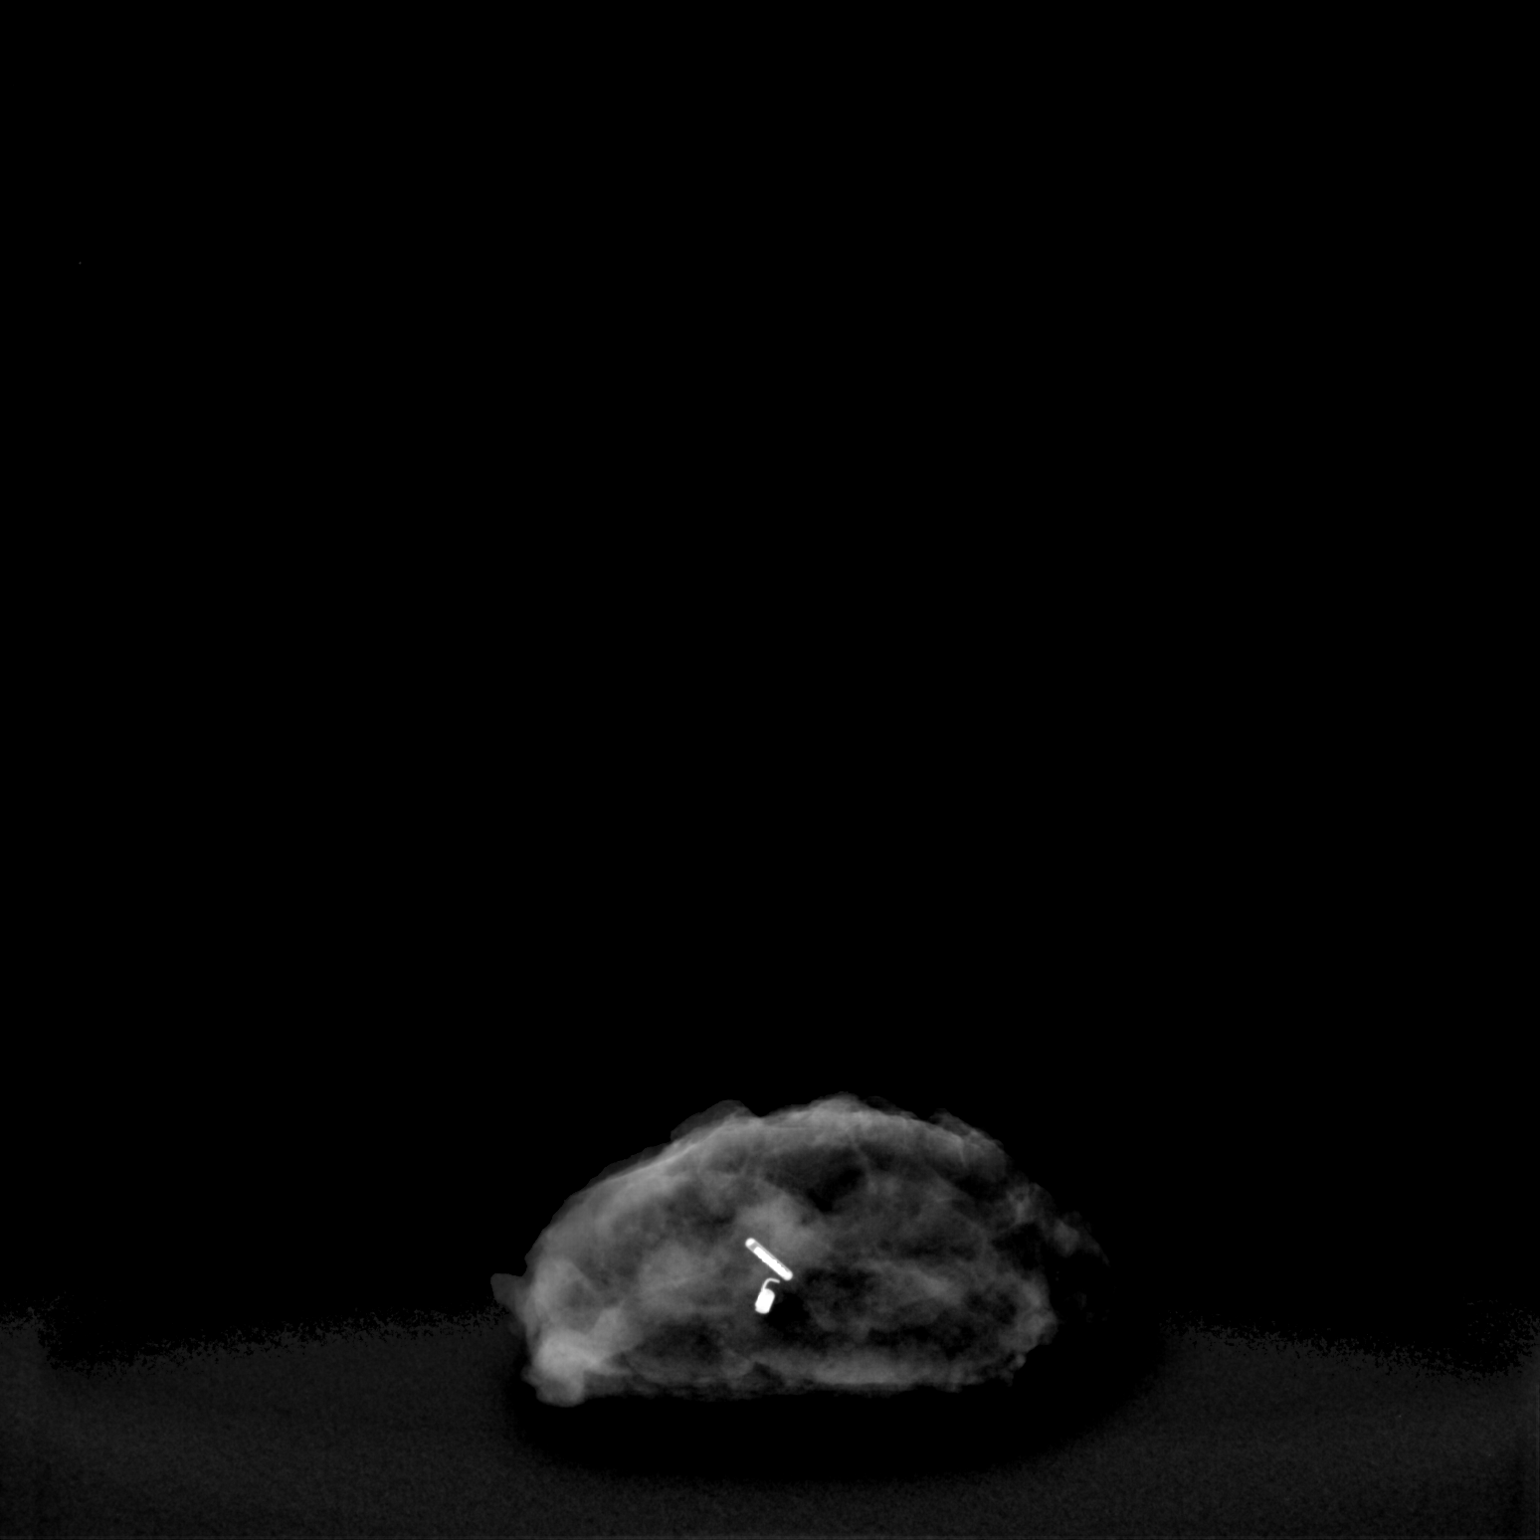

[2 of 2 positions shown; findings below may reference images not displayed]

FINDINGS: Status post excision of the left breast. The radioactive seed and
coil shaped biopsy marker clip are both present, completely intact,
and were marked for pathology.
IMPRESSION: Specimen radiograph of the left breast.

## 2020-06-14 DIAGNOSIS — D0512 Intraductal carcinoma in situ of left breast: Secondary | ICD-10-CM | POA: Diagnosis not present

## 2020-06-17 DIAGNOSIS — Z0000 Encounter for general adult medical examination without abnormal findings: Secondary | ICD-10-CM | POA: Diagnosis not present

## 2020-06-17 DIAGNOSIS — E7800 Pure hypercholesterolemia, unspecified: Secondary | ICD-10-CM | POA: Diagnosis not present

## 2020-06-17 DIAGNOSIS — I10 Essential (primary) hypertension: Secondary | ICD-10-CM | POA: Diagnosis not present

## 2020-06-17 DIAGNOSIS — R5383 Other fatigue: Secondary | ICD-10-CM | POA: Diagnosis not present

## 2020-06-17 DIAGNOSIS — Z7189 Other specified counseling: Secondary | ICD-10-CM | POA: Diagnosis not present

## 2020-06-17 DIAGNOSIS — Z6827 Body mass index (BMI) 27.0-27.9, adult: Secondary | ICD-10-CM | POA: Diagnosis not present

## 2020-06-17 DIAGNOSIS — Z23 Encounter for immunization: Secondary | ICD-10-CM | POA: Diagnosis not present

## 2020-06-17 DIAGNOSIS — Z79899 Other long term (current) drug therapy: Secondary | ICD-10-CM | POA: Diagnosis not present

## 2020-06-17 DIAGNOSIS — Z1331 Encounter for screening for depression: Secondary | ICD-10-CM | POA: Diagnosis not present

## 2020-06-17 DIAGNOSIS — Z299 Encounter for prophylactic measures, unspecified: Secondary | ICD-10-CM | POA: Diagnosis not present

## 2020-06-17 DIAGNOSIS — Z1339 Encounter for screening examination for other mental health and behavioral disorders: Secondary | ICD-10-CM | POA: Diagnosis not present

## 2020-08-29 DIAGNOSIS — I10 Essential (primary) hypertension: Secondary | ICD-10-CM | POA: Diagnosis not present

## 2020-08-29 DIAGNOSIS — Z299 Encounter for prophylactic measures, unspecified: Secondary | ICD-10-CM | POA: Diagnosis not present

## 2020-09-16 DIAGNOSIS — D239 Other benign neoplasm of skin, unspecified: Secondary | ICD-10-CM | POA: Diagnosis not present

## 2020-09-16 DIAGNOSIS — L810 Postinflammatory hyperpigmentation: Secondary | ICD-10-CM | POA: Diagnosis not present

## 2021-01-21 DIAGNOSIS — Z1231 Encounter for screening mammogram for malignant neoplasm of breast: Secondary | ICD-10-CM | POA: Diagnosis not present

## 2021-06-20 DIAGNOSIS — Z7189 Other specified counseling: Secondary | ICD-10-CM | POA: Diagnosis not present

## 2021-06-20 DIAGNOSIS — Z1339 Encounter for screening examination for other mental health and behavioral disorders: Secondary | ICD-10-CM | POA: Diagnosis not present

## 2021-06-20 DIAGNOSIS — Z1331 Encounter for screening for depression: Secondary | ICD-10-CM | POA: Diagnosis not present

## 2021-06-20 DIAGNOSIS — Z299 Encounter for prophylactic measures, unspecified: Secondary | ICD-10-CM | POA: Diagnosis not present

## 2021-06-20 DIAGNOSIS — I10 Essential (primary) hypertension: Secondary | ICD-10-CM | POA: Diagnosis not present

## 2021-06-20 DIAGNOSIS — Z6828 Body mass index (BMI) 28.0-28.9, adult: Secondary | ICD-10-CM | POA: Diagnosis not present

## 2021-06-20 DIAGNOSIS — E7800 Pure hypercholesterolemia, unspecified: Secondary | ICD-10-CM | POA: Diagnosis not present

## 2021-06-20 DIAGNOSIS — Z79899 Other long term (current) drug therapy: Secondary | ICD-10-CM | POA: Diagnosis not present

## 2021-06-20 DIAGNOSIS — Z0000 Encounter for general adult medical examination without abnormal findings: Secondary | ICD-10-CM | POA: Diagnosis not present

## 2021-06-20 DIAGNOSIS — R5383 Other fatigue: Secondary | ICD-10-CM | POA: Diagnosis not present

## 2021-09-24 DIAGNOSIS — I10 Essential (primary) hypertension: Secondary | ICD-10-CM | POA: Diagnosis not present

## 2021-09-24 DIAGNOSIS — Z299 Encounter for prophylactic measures, unspecified: Secondary | ICD-10-CM | POA: Diagnosis not present

## 2021-09-24 DIAGNOSIS — E7800 Pure hypercholesterolemia, unspecified: Secondary | ICD-10-CM | POA: Diagnosis not present

## 2022-01-30 DIAGNOSIS — Z1231 Encounter for screening mammogram for malignant neoplasm of breast: Secondary | ICD-10-CM | POA: Diagnosis not present

## 2022-03-19 DIAGNOSIS — Z789 Other specified health status: Secondary | ICD-10-CM | POA: Diagnosis not present

## 2022-03-19 DIAGNOSIS — I10 Essential (primary) hypertension: Secondary | ICD-10-CM | POA: Diagnosis not present

## 2022-03-19 DIAGNOSIS — Z299 Encounter for prophylactic measures, unspecified: Secondary | ICD-10-CM | POA: Diagnosis not present

## 2022-03-19 DIAGNOSIS — Z23 Encounter for immunization: Secondary | ICD-10-CM | POA: Diagnosis not present

## 2022-05-04 DIAGNOSIS — Z0000 Encounter for general adult medical examination without abnormal findings: Secondary | ICD-10-CM | POA: Diagnosis not present

## 2022-05-04 DIAGNOSIS — Z1339 Encounter for screening examination for other mental health and behavioral disorders: Secondary | ICD-10-CM | POA: Diagnosis not present

## 2022-05-04 DIAGNOSIS — R5383 Other fatigue: Secondary | ICD-10-CM | POA: Diagnosis not present

## 2022-05-04 DIAGNOSIS — E7800 Pure hypercholesterolemia, unspecified: Secondary | ICD-10-CM | POA: Diagnosis not present

## 2022-05-04 DIAGNOSIS — Z1331 Encounter for screening for depression: Secondary | ICD-10-CM | POA: Diagnosis not present

## 2022-05-04 DIAGNOSIS — Z6827 Body mass index (BMI) 27.0-27.9, adult: Secondary | ICD-10-CM | POA: Diagnosis not present

## 2022-05-04 DIAGNOSIS — Z299 Encounter for prophylactic measures, unspecified: Secondary | ICD-10-CM | POA: Diagnosis not present

## 2022-05-04 DIAGNOSIS — Z789 Other specified health status: Secondary | ICD-10-CM | POA: Diagnosis not present

## 2022-05-04 DIAGNOSIS — Z79899 Other long term (current) drug therapy: Secondary | ICD-10-CM | POA: Diagnosis not present

## 2022-05-04 DIAGNOSIS — Z7189 Other specified counseling: Secondary | ICD-10-CM | POA: Diagnosis not present

## 2022-05-11 DIAGNOSIS — I890 Lymphedema, not elsewhere classified: Secondary | ICD-10-CM | POA: Diagnosis not present

## 2022-10-08 DIAGNOSIS — E2839 Other primary ovarian failure: Secondary | ICD-10-CM | POA: Diagnosis not present

## 2022-10-08 DIAGNOSIS — Z1331 Encounter for screening for depression: Secondary | ICD-10-CM | POA: Diagnosis not present

## 2022-10-08 DIAGNOSIS — Z6827 Body mass index (BMI) 27.0-27.9, adult: Secondary | ICD-10-CM | POA: Diagnosis not present

## 2022-10-08 DIAGNOSIS — Z0000 Encounter for general adult medical examination without abnormal findings: Secondary | ICD-10-CM | POA: Diagnosis not present

## 2022-10-08 DIAGNOSIS — Z299 Encounter for prophylactic measures, unspecified: Secondary | ICD-10-CM | POA: Diagnosis not present

## 2022-10-08 DIAGNOSIS — Z7189 Other specified counseling: Secondary | ICD-10-CM | POA: Diagnosis not present

## 2022-10-08 DIAGNOSIS — Z1339 Encounter for screening examination for other mental health and behavioral disorders: Secondary | ICD-10-CM | POA: Diagnosis not present

## 2022-10-08 DIAGNOSIS — I10 Essential (primary) hypertension: Secondary | ICD-10-CM | POA: Diagnosis not present

## 2022-10-20 DIAGNOSIS — E2839 Other primary ovarian failure: Secondary | ICD-10-CM | POA: Diagnosis not present

## 2023-02-23 DIAGNOSIS — Z1231 Encounter for screening mammogram for malignant neoplasm of breast: Secondary | ICD-10-CM | POA: Diagnosis not present
